# Patient Record
Sex: Female | Born: 1950 | Marital: Married | Smoking: Never smoker
Health system: Southern US, Community
[De-identification: ages and names within clinical notes are randomized; demographics above are authoritative.]

## PROBLEM LIST (undated history)

## (undated) ENCOUNTER — Encounter

## (undated) ENCOUNTER — Ambulatory Visit

## (undated) ENCOUNTER — Encounter: Attending: Family | Primary: Family

## (undated) ENCOUNTER — Encounter
Attending: Student in an Organized Health Care Education/Training Program | Primary: Student in an Organized Health Care Education/Training Program

## (undated) ENCOUNTER — Other Ambulatory Visit

## (undated) ENCOUNTER — Ambulatory Visit
Payer: BLUE CROSS/BLUE SHIELD | Attending: Student in an Organized Health Care Education/Training Program | Primary: Student in an Organized Health Care Education/Training Program

## (undated) ENCOUNTER — Ambulatory Visit: Payer: BLUE CROSS/BLUE SHIELD | Attending: Family | Primary: Family

## (undated) ENCOUNTER — Telehealth

## (undated) ENCOUNTER — Ambulatory Visit: Payer: BLUE CROSS/BLUE SHIELD

## (undated) ENCOUNTER — Telehealth: Attending: Family | Primary: Family

## (undated) ENCOUNTER — Ambulatory Visit: Payer: BLUE CROSS/BLUE SHIELD | Attending: Mental Health | Primary: Mental Health

## (undated) ENCOUNTER — Encounter: Attending: Audiologist | Primary: Audiologist

## (undated) ENCOUNTER — Other Ambulatory Visit: Attending: Mental Health | Primary: Mental Health

## (undated) ENCOUNTER — Ambulatory Visit: Payer: Medicaid (Managed Care) | Attending: Family | Primary: Family

## (undated) ENCOUNTER — Ambulatory Visit: Payer: BLUE CROSS/BLUE SHIELD | Attending: Audiologist | Primary: Audiologist

## (undated) ENCOUNTER — Ambulatory Visit: Payer: BLUE CROSS/BLUE SHIELD | Attending: Clinical | Primary: Clinical

## (undated) ENCOUNTER — Ambulatory Visit: Payer: Medicaid (Managed Care)

## (undated) ENCOUNTER — Encounter: Attending: Mental Health | Primary: Mental Health

## (undated) DIAGNOSIS — I10 Essential (primary) hypertension: Secondary | ICD-10-CM

## (undated) DIAGNOSIS — E079 Disorder of thyroid, unspecified: Secondary | ICD-10-CM

## (undated) DIAGNOSIS — Z21 Asymptomatic human immunodeficiency virus [HIV] infection status: Secondary | ICD-10-CM

---

## 2011-08-02 ENCOUNTER — Ambulatory Visit: Payer: Self-pay | Admitting: Internal Medicine

## 2012-12-11 ENCOUNTER — Emergency Department: Payer: Self-pay | Admitting: Emergency Medicine

## 2012-12-11 ENCOUNTER — Ambulatory Visit: Payer: Self-pay | Admitting: Family Medicine

## 2012-12-11 LAB — COMPREHENSIVE METABOLIC PANEL
Albumin: 3.5 g/dL (ref 3.4–5.0)
Alkaline Phosphatase: 116 U/L (ref 50–136)
Co2: 25 mmol/L (ref 21–32)
EGFR (African American): >60
EGFR (Non-African Amer.): >60
Glucose: 103 mg/dL — ABNORMAL HIGH (ref 65–99)
Osmolality: 272 (ref 275–301)
Potassium: 4.1 mmol/L (ref 3.5–5.1)
SGPT (ALT): 23 U/L (ref 12–78)
Sodium: 136 mmol/L (ref 136–145)

## 2012-12-11 LAB — CBC WITH DIFFERENTIAL/PLATELET
Eosinophil #: 0.3 10*3/uL (ref 0.0–0.7)
Eosinophil %: 5.7 %
HCT: 35.1 % (ref 35.0–47.0)
Lymphocyte #: 2.7 10*3/uL (ref 1.0–3.6)
MCHC: 31.7 g/dL — ABNORMAL LOW (ref 32.0–36.0)
MCV: 80 fL (ref 80–100)
Neutrophil %: 36 %
RBC: 4.39 10*6/uL (ref 3.80–5.20)
RDW: 15.9 % — ABNORMAL HIGH (ref 11.5–14.5)
WBC: 5.3 10*3/uL (ref 3.6–11.0)

## 2013-01-12 ENCOUNTER — Ambulatory Visit: Payer: Self-pay | Admitting: Obstetrics and Gynecology

## 2013-01-12 DIAGNOSIS — Z01810 Encounter for preprocedural cardiovascular examination: Secondary | ICD-10-CM

## 2013-01-12 LAB — BASIC METABOLIC PANEL
Calcium, Total: 8.9 mg/dL (ref 8.5–10.1)
Chloride: 105 mmol/L (ref 98–107)
EGFR (Non-African Amer.): >60
Glucose: 118 mg/dL — ABNORMAL HIGH (ref 65–99)
Potassium: 4.1 mmol/L (ref 3.5–5.1)
Sodium: 135 mmol/L — ABNORMAL LOW (ref 136–145)

## 2013-01-12 LAB — CBC
HGB: 10.7 g/dL — ABNORMAL LOW (ref 12.0–16.0)
MCH: 24.6 pg — ABNORMAL LOW (ref 26.0–34.0)
MCHC: 31.3 g/dL — ABNORMAL LOW (ref 32.0–36.0)
MCV: 79 fL — ABNORMAL LOW (ref 80–100)
RBC: 4.36 10*6/uL (ref 3.80–5.20)
RDW: 16.4 % — ABNORMAL HIGH (ref 11.5–14.5)
WBC: 5.8 10*3/uL (ref 3.6–11.0)

## 2013-01-25 ENCOUNTER — Ambulatory Visit: Payer: Self-pay | Admitting: Obstetrics and Gynecology

## 2013-01-26 LAB — PATHOLOGY REPORT

## 2014-04-15 ENCOUNTER — Ambulatory Visit: Payer: Self-pay | Admitting: Physician Assistant

## 2014-05-14 NOTE — Op Note (Signed)
PATIENT NAME:  Brenda Shah, Meelah A MR#:  [id number] DATE OF BIRTH:  05-Feb-1950  DATE OF PROCEDURE:  01/25/2013  PREOPERATIVE DIAGNOSES: 1.  Menorrhagia.  2.  Endometrial polyps.  3.  Postmenopausal bleeding.   POSTOPERATIVE DIAGNOSES: 1.  Menorrhagia.  2.  Endometrial polyps.  3.  Postmenopausal bleeding.  4.  Submucosal fibroids.   SURGEON: Herold HarmsMartin A Shavelle Runkel, M.D.   FIRST ASSISTANT: None.   ANESTHESIA: General endotracheal.   INDICATIONS: The patient is a 64 year old African American female who presents for surgical evaluation of menorrhagia and postmenopausal bleeding. Preoperative endometrial biopsy revealed endometrial polyp fragments. Ultrasound confirmed a heterogenous endometrial stripe suspicious for polyps.   FINDINGS AT SURGERY: Included several submucosal fibroids within the uterine cavity.  Each upon extraction were noted to be approximately 1.5 cm in diameter. Several other polyps were also seen.   DESCRIPTION OF PROCEDURE: The patient was brought to the Operating Room where she was placed in the supine position. General endotracheal anesthesia was induced without difficulty. She was placed in the dorsal lithotomy position using the candy-cane stirrups. A Betadine perineal, intravaginal prep and drape was performed in standard fashion. Red Robinson catheter was used to drain 50 mL of urine from the bladder. A weighted speculum was placed into the vagina, and a single-tooth tenaculum was placed on the anterior lip of the cervix. The uterus was sounded to 8 cm. The Hanks dilators were used up to a #20 French-caliber to dilate the endocervical canal. The [facility] hysteroscope using lactated Ringer's fluid as irrigant was utilized to assess the endometrial cavity. The above-noted findings were photo documented. The curettage was then performed with both smooth and serrated curettes. Stone polyp forceps and ring forceps were used to perform the myomectomies. Upon completion of the  surgical procedure, repeat hysteroscopy was performed with verification that the large lesions were removed. No other significant residual fibroids were noted to be left behind. The procedure was then terminated with all instrumentation being removed from the vagina. Using patient was then awakened, extubated and taken to the recovery room in satisfactory condition.   ESTIMATED BLOOD LOSS: 25 mL.   All instruments, needle and sponge counts were verified as correct.   COMPLICATIONS: None.   ____________________________ Prentice DockerMartin A. Elasha Tess, MD mad:cs D: 01/25/2013 14:11:00 ET T: 01/25/2013 18:49:07 ET JOB#: [id number]  cc: Daphine DeutscherMartin A. Kell Ferris, MD, <Dictator> [facility] Prentice DockerMARTIN A Yusuf Yu MD ELECTRONICALLY SIGNED 02/03/2013 13:05

## 2015-11-03 IMAGING — US US PELV - US TRANSVAGINAL
1 series · 13 of 25 positions shown · non-contrast
Comparison: None

CLINICAL DATA: Postmenopausal bleeding.

EXAM:
TRANSABDOMINAL AND TRANSVAGINAL ULTRASOUND OF PELVIS
TECHNIQUE: Both transabdominal and transvaginal ultrasound examinations of the
pelvis were performed. Transabdominal technique was performed for
global imaging of the pelvis including uterus, ovaries, adnexal
regions, and pelvic cul-de-sac. It was necessary to proceed with
endovaginal exam following the transabdominal exam to visualize the
uterus, endometrium, ovaries and adnexa .

[Series 1: us pelv - us transvaginal · 0.24mm/px · 13 of 51 slices shown]
[im 1/51]
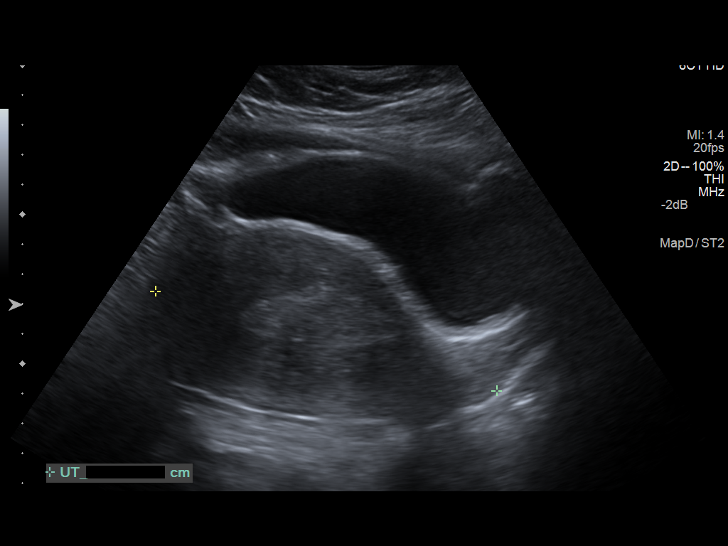
[im 5/51]
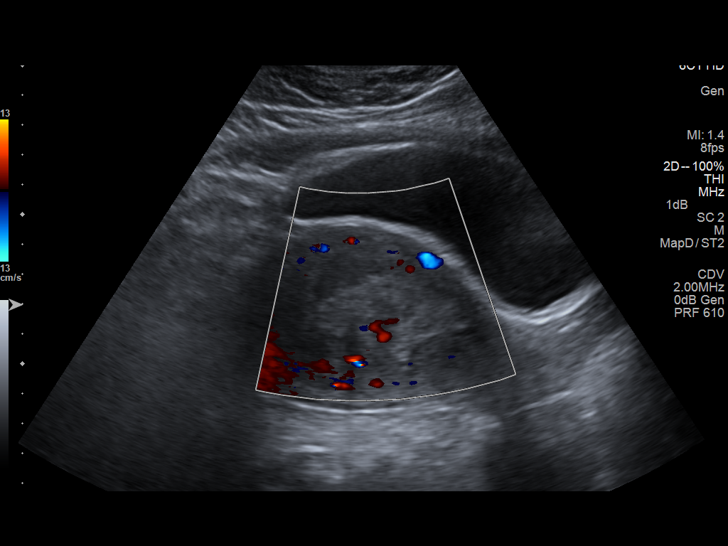
[im 9/51]
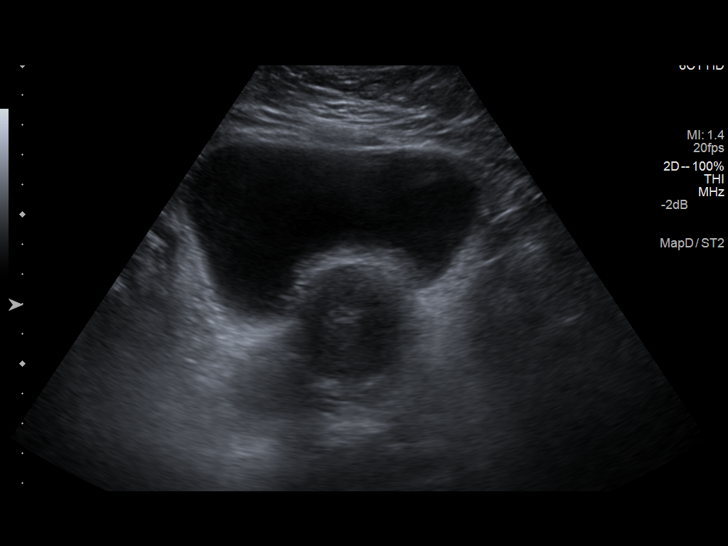
[im 13/51]
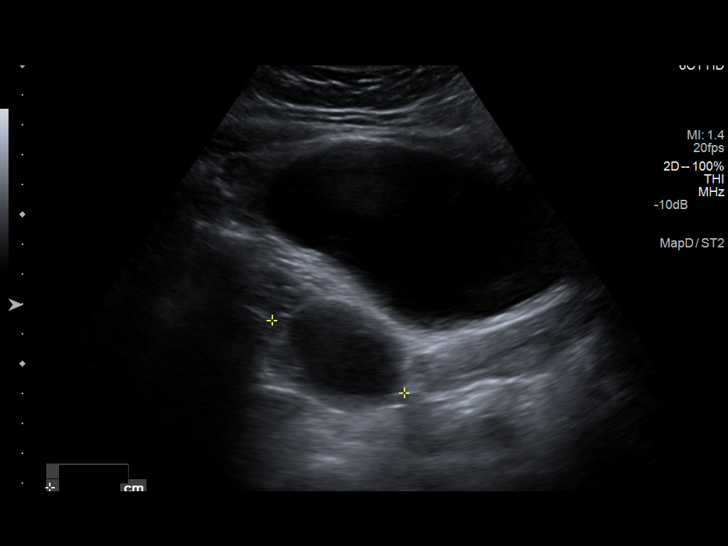
[im 17/51]
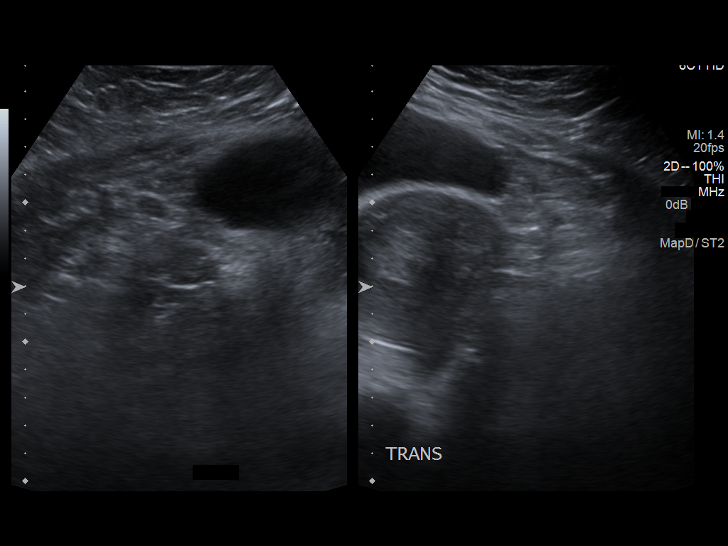
[im 21/51]
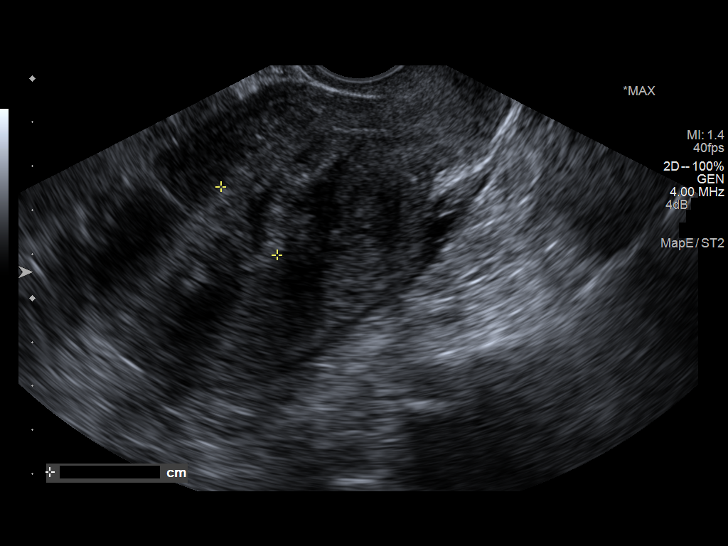
[im 26/51]
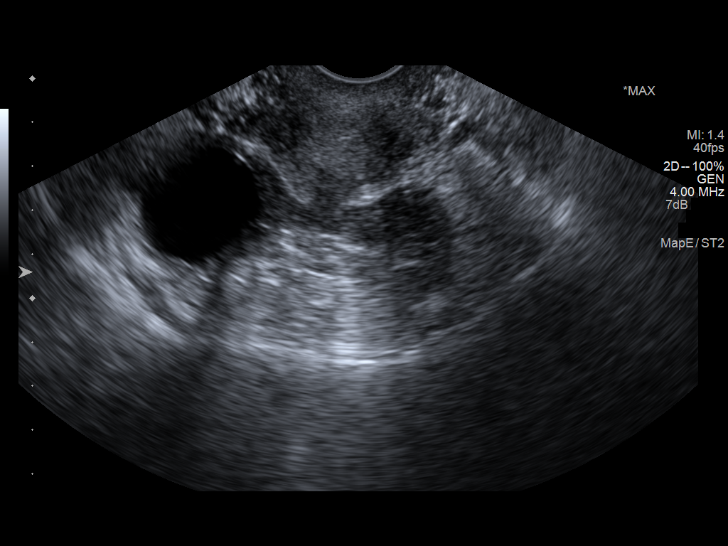
[im 30/51]
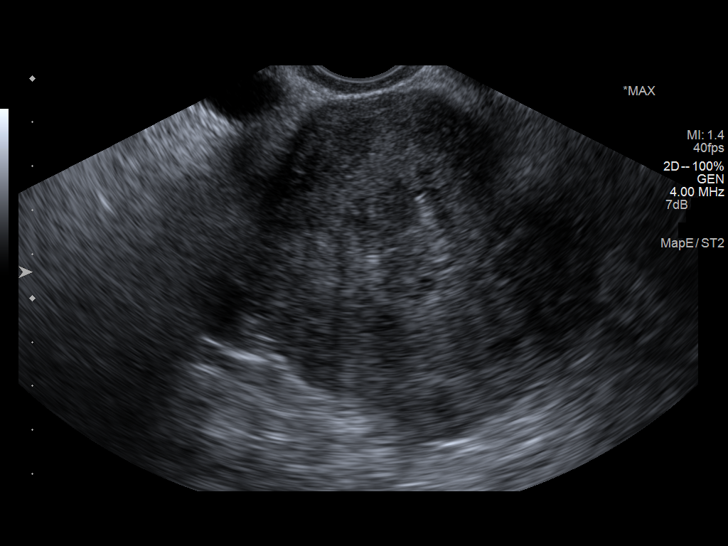
[im 34/51]
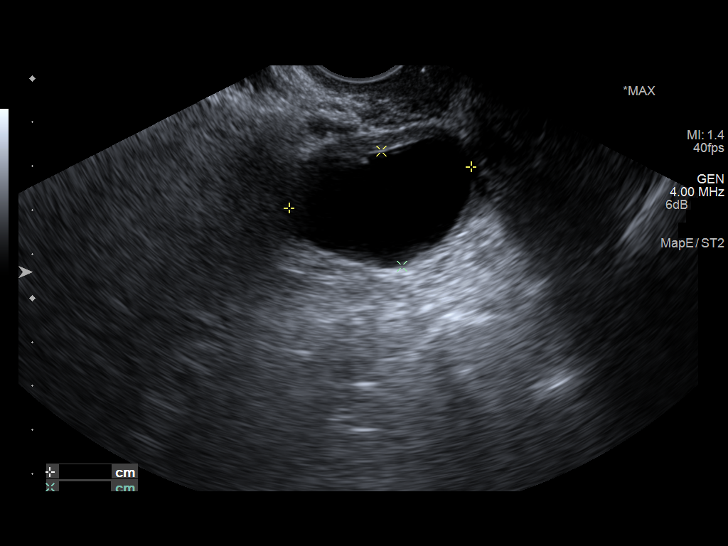
[im 38/51]
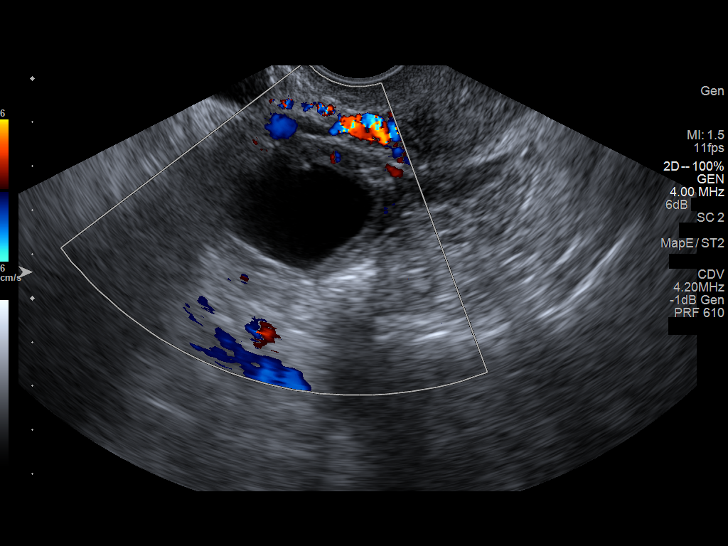
[im 42/51]
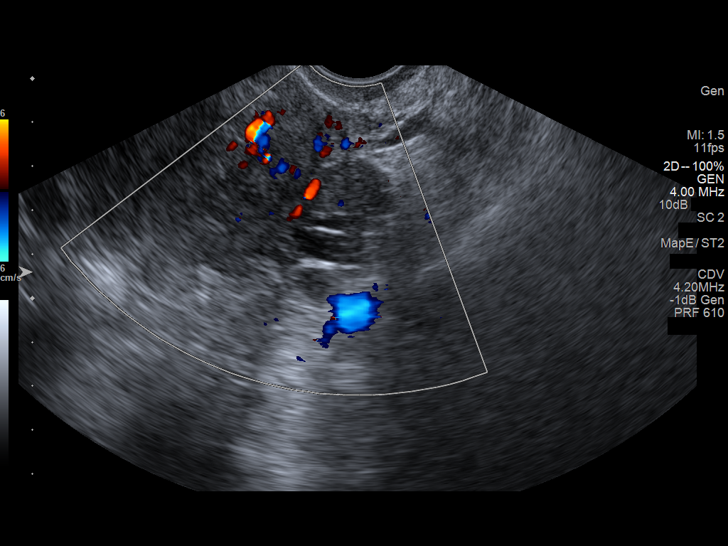
[im 46/51]
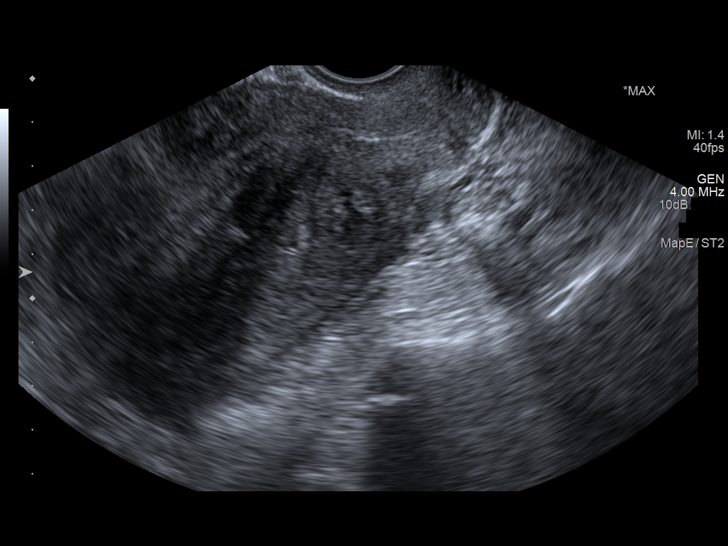
[im 51/51]
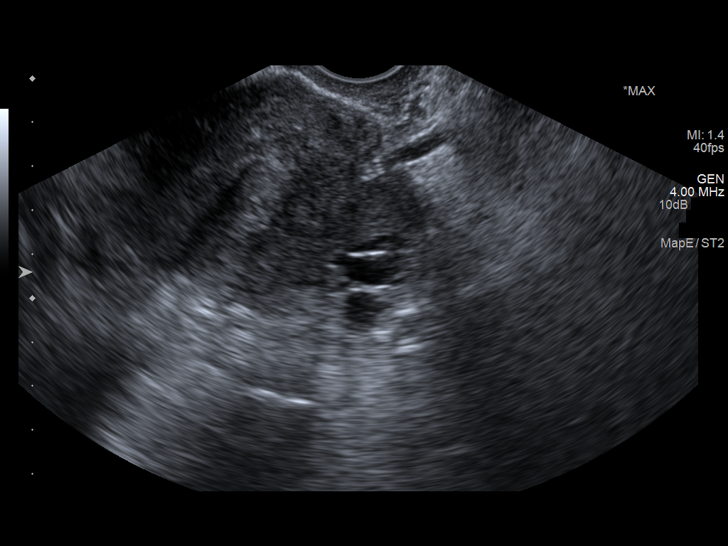

[13 of 25 positions shown; findings below may reference images not displayed]

FINDINGS: Uterus

Measurements: 11.9 x 5.8 x 7.4 cm. No focal abnormality. No fibroids
or other mass visualized.

Endometrium

Thickness: 23 mm in thickness, heterogeneous..

Right ovary

Measurements: 4.7 x 3.2 x 4.4 cm. 4.2 cm simple appearing cyst.

Left ovary

Measurements: 2.8 x 2.2 x 2.6 cm. Normal appearance/no adnexal mass.

Other findings

No free fluid.
IMPRESSION: Endometrium thickened at 23 mm and heterogeneous. Endometrial
thickness is considered abnormal for a post-menopausal female.
Endometrial sampling should be considered to exclude carcinoma.

4.2 cm simple appearing right ovarian cyst This is almost certainly
benign, but follow up ultrasound is recommended in 1 year according
to the Society of Radiologists in 8ltrasoundSBFB Consensus
Conference Statement (Bettini Tinoco et al. Management of Asymptomatic
Ovarian and Other Adnexal Cysts Imaged at US: Society of
Radiologists in Ultrasound Consensus Conference Statement 4454.
Radiology [DATE]): [id number].). .

## 2020-04-14 ENCOUNTER — Ambulatory Visit: Admit: 2020-04-14 | Discharge: 2020-04-15 | Payer: BLUE CROSS/BLUE SHIELD

## 2020-04-14 DIAGNOSIS — Z9189 Other specified personal risk factors, not elsewhere classified: Principal | ICD-10-CM

## 2020-04-14 DIAGNOSIS — H9190 Unspecified hearing loss, unspecified ear: Principal | ICD-10-CM

## 2020-04-14 DIAGNOSIS — Z0000 Encounter for general adult medical examination without abnormal findings: Principal | ICD-10-CM

## 2020-04-14 DIAGNOSIS — D259 Leiomyoma of uterus, unspecified: Principal | ICD-10-CM

## 2020-04-14 DIAGNOSIS — Z1159 Encounter for screening for other viral diseases: Principal | ICD-10-CM

## 2020-04-14 DIAGNOSIS — L309 Dermatitis, unspecified: Principal | ICD-10-CM

## 2020-04-14 DIAGNOSIS — Z1239 Encounter for other screening for malignant neoplasm of breast: Principal | ICD-10-CM

## 2020-04-14 DIAGNOSIS — Z0100 Encounter for examination of eyes and vision without abnormal findings: Principal | ICD-10-CM

## 2020-04-14 DIAGNOSIS — F1010 Alcohol abuse, uncomplicated: Principal | ICD-10-CM

## 2020-04-14 MED ORDER — HYDROXYZINE HCL 25 MG TABLET
ORAL_TABLET | Freq: Three times a day (TID) | ORAL | 0 refills | 30 days | Status: CP | PRN
Start: 2020-04-14 — End: ?

## 2020-04-14 MED ORDER — TRIAMCINOLONE ACETONIDE 0.1 % TOPICAL CREAM
Freq: Two times a day (BID) | TOPICAL | 1 refills | 0 days | Status: CP
Start: 2020-04-14 — End: ?

## 2020-04-17 DIAGNOSIS — R7989 Other specified abnormal findings of blood chemistry: Principal | ICD-10-CM

## 2020-06-27 DIAGNOSIS — Z1239 Encounter for other screening for malignant neoplasm of breast: Principal | ICD-10-CM

## 2020-06-28 ENCOUNTER — Encounter: Admit: 2020-06-28 | Discharge: 2020-06-29 | Payer: BLUE CROSS/BLUE SHIELD

## 2020-06-29 ENCOUNTER — Encounter
Admit: 2020-06-29 | Discharge: 2020-06-29 | Payer: BLUE CROSS/BLUE SHIELD | Attending: Student in an Organized Health Care Education/Training Program | Primary: Student in an Organized Health Care Education/Training Program

## 2020-06-29 ENCOUNTER — Encounter: Admit: 2020-06-29 | Discharge: 2020-06-29 | Payer: BLUE CROSS/BLUE SHIELD

## 2020-06-29 DIAGNOSIS — H25813 Combined forms of age-related cataract, bilateral: Principal | ICD-10-CM

## 2020-06-29 DIAGNOSIS — M3500 Sicca syndrome, unspecified: Principal | ICD-10-CM

## 2020-06-29 DIAGNOSIS — H5203 Hypermetropia, bilateral: Principal | ICD-10-CM

## 2020-06-29 DIAGNOSIS — Z0100 Encounter for examination of eyes and vision without abnormal findings: Principal | ICD-10-CM

## 2020-06-29 DIAGNOSIS — H04123 Dry eye syndrome of bilateral lacrimal glands: Principal | ICD-10-CM

## 2020-06-29 DIAGNOSIS — H3581 Retinal edema: Principal | ICD-10-CM

## 2020-07-04 DIAGNOSIS — M810 Age-related osteoporosis without current pathological fracture: Principal | ICD-10-CM

## 2020-07-04 MED ORDER — ALENDRONATE 70 MG TABLET
ORAL_TABLET | ORAL | 11 refills | 28 days | Status: CP
Start: 2020-07-04 — End: 2021-07-04

## 2020-07-06 ENCOUNTER — Ambulatory Visit: Admit: 2020-07-06 | Payer: BLUE CROSS/BLUE SHIELD | Attending: Audiologist | Primary: Audiologist

## 2020-07-10 ENCOUNTER — Ambulatory Visit
Admit: 2020-07-10 | Discharge: 2020-07-11 | Payer: BLUE CROSS/BLUE SHIELD | Attending: Student in an Organized Health Care Education/Training Program | Primary: Student in an Organized Health Care Education/Training Program

## 2020-07-11 ENCOUNTER — Ambulatory Visit: Admit: 2020-07-11 | Discharge: 2020-07-12 | Payer: BLUE CROSS/BLUE SHIELD

## 2020-07-11 DIAGNOSIS — B20 Human immunodeficiency virus [HIV] disease: Principal | ICD-10-CM

## 2020-07-11 DIAGNOSIS — E785 Hyperlipidemia, unspecified: Principal | ICD-10-CM

## 2020-07-11 DIAGNOSIS — L89302 Pressure ulcer of unspecified buttock, stage 2: Principal | ICD-10-CM

## 2020-07-11 DIAGNOSIS — M810 Age-related osteoporosis without current pathological fracture: Principal | ICD-10-CM

## 2020-07-11 DIAGNOSIS — H3500 Unspecified background retinopathy: Principal | ICD-10-CM

## 2020-07-11 DIAGNOSIS — Z8669 Personal history of other diseases of the nervous system and sense organs: Principal | ICD-10-CM

## 2020-07-11 DIAGNOSIS — E039 Hypothyroidism, unspecified: Principal | ICD-10-CM

## 2020-07-11 DIAGNOSIS — D649 Anemia, unspecified: Principal | ICD-10-CM

## 2020-07-11 MED ORDER — MUPIROCIN 2 % TOPICAL OINTMENT
Freq: Three times a day (TID) | TOPICAL | 0 refills | 7.00000 days | Status: CP
Start: 2020-07-11 — End: 2020-07-18

## 2020-07-11 MED ORDER — LEVOTHYROXINE 50 MCG TABLET
ORAL_TABLET | Freq: Every day | ORAL | 1 refills | 90.00000 days | Status: CP
Start: 2020-07-11 — End: 2020-07-11

## 2020-07-11 MED ORDER — SULFAMETHOXAZOLE 400 MG-TRIMETHOPRIM 80 MG TABLET
ORAL_TABLET | Freq: Two times a day (BID) | ORAL | 0 refills | 10.00000 days | Status: CP
Start: 2020-07-11 — End: 2020-07-21

## 2020-07-17 MED ORDER — ALENDRONATE 70 MG TABLET
ORAL_TABLET | ORAL | 11 refills | 28 days | Status: CP
Start: 2020-07-17 — End: 2021-07-17

## 2020-07-20 ENCOUNTER — Ambulatory Visit: Admit: 2020-07-20 | Discharge: 2020-07-21 | Payer: BLUE CROSS/BLUE SHIELD | Attending: Clinical | Primary: Clinical

## 2020-07-20 ENCOUNTER — Ambulatory Visit: Admit: 2020-07-20 | Discharge: 2020-07-21 | Payer: BLUE CROSS/BLUE SHIELD | Attending: Family | Primary: Family

## 2020-07-20 DIAGNOSIS — B20 Human immunodeficiency virus [HIV] disease: Principal | ICD-10-CM

## 2020-07-20 DIAGNOSIS — R238 Other skin changes: Principal | ICD-10-CM

## 2020-07-20 MED ORDER — VALACYCLOVIR 1 GRAM TABLET
ORAL_TABLET | Freq: Two times a day (BID) | ORAL | 0 refills | 10 days | Status: CP
Start: 2020-07-20 — End: 2020-07-30
  Filled 2020-07-20: qty 20, 10d supply, fill #0

## 2020-07-20 MED ORDER — BICTEGRAVIR 50 MG-EMTRICITABINE 200 MG-TENOFOVIR ALAFENAM 25 MG TABLET
ORAL_TABLET | Freq: Every day | ORAL | 0 refills | 30.00000 days | Status: CP
Start: 2020-07-20 — End: ?

## 2020-07-20 MED ORDER — CLINDAMYCIN HCL 300 MG CAPSULE
ORAL_CAPSULE | Freq: Four times a day (QID) | ORAL | 0 refills | 5.00000 days | Status: CP
Start: 2020-07-20 — End: 2020-07-25
  Filled 2020-07-20: qty 20, 5d supply, fill #0

## 2020-07-21 MED ORDER — SULFAMETHOXAZOLE 400 MG-TRIMETHOPRIM 80 MG TABLET
ORAL_TABLET | Freq: Every day | ORAL | 5 refills | 30 days | Status: CP
Start: 2020-07-21 — End: ?
  Filled 2020-07-26: qty 30, 30d supply, fill #0

## 2020-07-21 MED ORDER — BICTEGRAVIR 50 MG-EMTRICITABINE 200 MG-TENOFOVIR ALAFENAM 25 MG TABLET
ORAL_TABLET | Freq: Every day | ORAL | 5 refills | 30.00000 days | Status: CP
Start: 2020-07-21 — End: ?
  Filled 2020-07-20 – 2020-08-14 (×2): qty 30, 30d supply, fill #0

## 2020-07-27 DIAGNOSIS — B009 Herpesviral infection, unspecified: Principal | ICD-10-CM

## 2020-07-27 DIAGNOSIS — B20 Human immunodeficiency virus [HIV] disease: Principal | ICD-10-CM

## 2020-07-27 MED ORDER — VALACYCLOVIR 500 MG TABLET
ORAL_TABLET | Freq: Two times a day (BID) | ORAL | 5 refills | 30.00000 days | Status: CP
Start: 2020-07-27 — End: 2020-07-27
  Filled 2020-07-28: qty 60, 30d supply, fill #0

## 2020-08-14 NOTE — Unmapped (Signed)
[facility] Specialty Medication Onboarding    Specialty Medication: BIKTARVY 50-200-25 mg tablet (bictegrav-emtricit-tenofov ala)  Prior Authorization: Not Required   Financial Assistance: No - copay  <$25  Final Copay/Day Supply: $4 / 30    Insurance Restrictions: None     Notes to Pharmacist: n/a    The triage team has completed the benefits investigation and has determined that the patient is able to fill this medication at [facility]. Please contact the patient to complete the onboarding or follow up with the prescribing physician as needed.

## 2020-08-14 NOTE — Unmapped (Signed)
-----   Message from Amie Portland, FNP sent at 06/28/2020  4:50 PM EDT -----  Regarding: RE: Need new orders placed  Thank you for notifying me   I was not aware of any lump that she was concerned about  I have reached out to the pt to schedule an appt for evaluation   Ali Lowe FNP-C  ----- Message -----  From: Barnett Hatter, RT  Sent: 06/28/2020   2:21 PM EDT  To: Henrine Screws, Amie Portland, FNP  Subject: Need new orders placed                           Also sent on secure chat:    Patient [facility] for a screening mammogram and states that she has a lump in the Right Medial Breast.  Patient needs to be Rescheduled and a New order placed for Bilat diagnostic mammogram with tomo and Right Breast Ultrasound Limited.  If you will please place the orders, we can call the pt and then schedule appt.  Thanks!!    [employer] RTRM

## 2020-08-14 NOTE — Unmapped (Signed)
[facility]   Patient Onboarding/Medication Counseling    Ms.Breau is a 70 y.o. female with HIV who I am counseling today on continuation of therapy.  I am speaking to the patient's family member, Saralyn Pilar (daughter).    Was a Nurse, learning disability used for this call? No    Verified patient's date of birth / HIPAA.    Specialty medication(s) to be sent: Infectious Disease: Biktarvy      Non-specialty medications/supplies to be sent: smz-tmp 400-80mg  and valacyclovir 500mg       Medications not needed at this time: n/a         Biktarvy (bictegravir, emtricitabine, and tenofovir alafenamide)    Medication & Administration     Dosage: Take 1 tablet by mouth daily    Administration: Take without regard to food    Adherence/Missed dose instructions: take missed dose as soon as you remember. If it is close to the time of your next dose, skip the dose and resume with your next scheduled dose.    Goals of Therapy     To suppress viral replication and keep patient's HIV undetectable by lab tests    Side Effects & Monitoring Parameters     Common Side Effects:  ??? Diarrhea  ??? Upset stomach  ??? Headache  ??? Changes in Weight  ??? Changes in mood    The following side effects should be reported to the provider:     ?? If patient experiences: signs of an allergic reaction (rash; hives; itching; red, swollen, blistered, or peeling skin with or without fever; wheezing; tightness in the chest or throat; trouble breathing, swallowing, or talking; unusual hoarseness; or swelling of the mouth, face, lips, tongue, or throat)  ?? signs of kidney problems (unable to pass urine, change in how much urine is passed, blood in the urine, or a big weight gain)  ?? signs of liver problems (dark urine, feeling tired, not hungry, upset stomach or stomach pain, light-colored stools, throwing up, or yellow skin or eyes)  ?? signs of lactic acidosis (fast breathing, fast heartbeat, a heartbeat that does not feel normal, very bad upset stomach or throwing up, feeling very sleepy, shortness of breath, feeling very tired or weak, very bad dizziness, feeling cold, or muscle pain or cramps)  Weight gain: some patients have reported weight gain after starting this medication. The amount of weight can vary.    Monitoring Parameters:  ?? CD4  Count  ?? HIV RNA plasma levels,  ?? Liver function  ?? Total bilirubin  ?? serum creatinine  ?? urine glucose  ?? urine protein (prior to or when initiating therapy and as clinically indicated during therapy);       Drug/Food Interactions     ??? Medication list reviewed in Epic. The patient was instructed to inform the care team before taking any new medications or supplements. No drug interactions identified.   ??? Calcium Salts: May decrease the serum concentration of Biktarvy. If taken with food, Biktarvy can be administered with calcium salts.   ??? Iron Preparations: May decrease the serum concentration of Biktarvy. If taken with food, Biktarvy can be administered with Ferrous sulfate. If taken on an empty stomach, Biktarvy must be taken 2 hours before ferrous sulfate. Avoid other iron salts.    Contraindications, Warnings, & Precautions     ??? Black Box Warning: Severe acute exacertbations of HBV have been reported in patients coinfected with HIV-1 and HBV fllowing discontinuation of therapy  ??? Coadministration  with dofetilide, rifampin is contraindicated  ??? Immune reconstitution syndrome: Patients may develop immune reconstitution syndrome, resulting in the occurrence of an inflammatory response to an indolent or residual opportunistic infection or activation of autoimmune disorders (eg, Graves disease, polymyositis, Guillain-Barr?? syndrome, autoimmune hepatitis)   ??? Lactic acidosis/hepatomegaly  ??? Renal toxicity: patients with preexisting renal impairment and those taking nephrotoxic agents (including NSAIDs) are at increased risk.     Storage, Handling Precautions, & Disposal     ??? Store in the original container at room temperature.   ??? Keep lid tightly closed.   ??? Store in a dry place. Do not store in a bathroom.   ??? Keep all drugs in a safe place. Keep all drugs out of the reach of children and pets.   ??? Throw away unused or expired drugs. Do not flush down a toilet or pour down a drain unless you are told to do so. Check with your pharmacist if you have questions about the best way to throw out drugs. There may be drug take-back programs in your area.        Current Medications (including OTC/herbals), Comorbidities and Allergies     Current Outpatient Medications   Medication Sig Dispense Refill   ??? alendronate (FOSAMAX) 70 MG tablet Take 1 tablet (70 mg total) by mouth every seven (7) days. 4 tablet 11   ??? bictegrav-emtricit-tenofov ala (BIKTARVY) 50-200-25 mg tablet Take 1 tablet by mouth in the morning. 30 tablet 5   ??? hydrOXYzine (ATARAX) 25 MG tablet Take 1 tablet (25 mg total) by mouth Three (3) times a day as needed. 90 tablet 0   ??? levothyroxine (SYNTHROID) 50 MCG tablet Take 1 tablet (50 mcg total) by mouth in the morning. 90 tablet 1   ??? multivitamin (TAB-A-VITE/THERAGRAN) per tablet Take 1 tablet by mouth daily.     ??? sulfamethoxazole-trimethoprim (BACTRIM) 400-80 mg per tablet Take 1 tablet (80 mg of trimethoprim total) by mouth in the morning. 30 tablet 5   ??? triamcinolone (KENALOG) 0.1 % cream Apply topically Two (2) times a day. (Patient not taking: Reported on 08/14/2020) 80 g 1   ??? valACYclovir (VALTREX) 500 MG tablet Take 1 tablet (500 mg total) by mouth Two (2) times a day. Start taking this medicine after you finish the first prescription for Valtrex (valacyclovir) that I gave you. 60 tablet 5     No current facility-administered medications for this visit.       Allergies   Allergen Reactions   ??? Tetanus-Diphtheria Toxoids-Td Rash       Patient Active Problem List   Diagnosis   ??? Dermatitis   ??? Alcohol abuse   ??? Uterine leiomyoma   ??? Hearing loss   ??? HIV infection (CMS-HCC)   ??? History of Bell's palsy ??? Hyperlipidemia   ??? Anemia   ??? Hypothyroidism   ??? Pressure injury of buttock, stage 2 (CMS-HCC)   ??? Retinopathy   ??? Osteoporosis       Reviewed and up to date in Epic.    Appropriateness of Therapy     Acute infections noted within Epic:  No active infections  Patient reported infection: None    Is medication and dose appropriate based on diagnosis and infection status? Yes    Prescription has been clinically reviewed: Yes      Baseline Quality of Life Assessment      How many days over the past month did your HIV  keep you from your  normal activities? For example, brushing your teeth or getting up in the morning. 0    Financial Information     Medication Assistance provided: None Required    Anticipated copay of $4.00 reviewed with patient. Verified delivery address.    Delivery Information     Scheduled delivery date: 08/15/20 for Biktarvy and 08/22/20 for valacyclovir and smz-tmp    Expected start date: continuation of current therapy    Medication will be delivered via Next Day Courier for Biktarvy and UPS for valacyclovir and smz-tmp to the prescription address in Epic WAM.  This shipment will not require a signature.      Explained the services we provide at [facility] and that each month we would call to set up refills.  Stressed importance of returning phone calls so that we could ensure they receive their medications in time each month.  Informed patient that we should be setting up refills 7-10 days prior to when they will run out of medication.  A pharmacist will reach out to perform a clinical assessment periodically.  Informed patient that a welcome packet, containing information about our pharmacy and other support services, a Notice of Privacy Practices, and a drug information handout will be sent.      The patient or caregiver noted above participated in the development of this care plan and knows that they can request review of or adjustments to the care plan at any time. Patient or caregiver verbalized understanding of the above information as well as how to contact the pharmacy at [phone] option 4 with any questions/concerns.  The pharmacy is open Monday through Friday 8:30am-4:30pm.  A pharmacist is available 24/7 via pager to answer any clinical questions they may have.    Patient Specific Needs     - Does the patient have any physical, cognitive, or cultural barriers? No    - Does the patient have adequate living arrangements? (i.e. the ability to store and take their medication appropriately) Yes    - Did you identify any home environmental safety or security hazards? No    - Patient prefers to have medications discussed with  Family Member     - Is the patient or caregiver able to read and understand education materials at a high school level or above? Yes    - [name] primary language is  Tshiluba     - Is the patient high risk? No    - Does the patient require physician intervention or other additional services (i.e. dietary/nutrition, smoking cessation, social work)? No      Roderic Palau  [facility] Specialty Pharmacist

## 2020-08-21 MED FILL — VALACYCLOVIR 500 MG TABLET: ORAL | 30 days supply | Qty: 60 | Fill #1

## 2020-08-21 MED FILL — SULFAMETHOXAZOLE 400 MG-TRIMETHOPRIM 80 MG TABLET: ORAL | 30 days supply | Qty: 30 | Fill #1

## 2020-09-06 ENCOUNTER — Ambulatory Visit: Admit: 2020-09-06 | Discharge: 2020-09-07 | Payer: BLUE CROSS/BLUE SHIELD

## 2020-09-06 DIAGNOSIS — B20 Human immunodeficiency virus [HIV] disease: Principal | ICD-10-CM

## 2020-09-06 DIAGNOSIS — M810 Age-related osteoporosis without current pathological fracture: Principal | ICD-10-CM

## 2020-09-06 DIAGNOSIS — E039 Hypothyroidism, unspecified: Principal | ICD-10-CM

## 2020-09-06 DIAGNOSIS — E785 Hyperlipidemia, unspecified: Principal | ICD-10-CM

## 2020-09-06 DIAGNOSIS — F1010 Alcohol abuse, uncomplicated: Principal | ICD-10-CM

## 2020-09-06 MED ORDER — LEVOTHYROXINE 50 MCG TABLET
ORAL_TABLET | Freq: Every day | ORAL | 1 refills | 90 days | Status: CP
Start: 2020-09-06 — End: ?
  Filled 2020-10-11: qty 90, 90d supply, fill #0

## 2020-09-06 MED ORDER — ALENDRONATE 70 MG TABLET
ORAL_TABLET | ORAL | 11 refills | 28.00000 days | Status: CP
Start: 2020-09-06 — End: 2021-09-06
  Filled 2020-10-11: qty 4, 28d supply, fill #0

## 2020-09-06 NOTE — Unmapped (Signed)
[facility] Clinical Assessment & Refill Coordination Note    Joan Burns, DOB: 1950-09-03  Phone: [phone] (home)     All above HIPAA information was verified with patient's family member, Saralyn Pilar (daughter).     Was a Nurse, learning disability used for this call? No    Specialty Medication(s):   Infectious Disease: Biktarvy     Current Outpatient Medications   Medication Sig Dispense Refill   ??? alendronate (FOSAMAX) 70 MG tablet Take 1 tablet (70 mg total) by mouth every seven (7) days. 4 tablet 11   ??? bictegrav-emtricit-tenofov ala (BIKTARVY) 50-200-25 mg tablet Take 1 tablet by mouth in the morning. 30 tablet 5   ??? hydrOXYzine (ATARAX) 25 MG tablet Take 1 tablet (25 mg total) by mouth Three (3) times a day as needed. 90 tablet 0   ??? levothyroxine (SYNTHROID) 50 MCG tablet Take 1 tablet (50 mcg total) by mouth in the morning. 90 tablet 1   ??? multivitamin (TAB-A-VITE/THERAGRAN) per tablet Take 1 tablet by mouth daily.     ??? sulfamethoxazole-trimethoprim (BACTRIM) 400-80 mg per tablet Take 1 tablet (80 mg of trimethoprim total) by mouth in the morning. 30 tablet 5   ??? triamcinolone (KENALOG) 0.1 % cream Apply topically Two (2) times a day. (Patient not taking: Reported on 08/14/2020) 80 g 1   ??? valACYclovir (VALTREX) 500 MG tablet Take 1 tablet (500 mg total) by mouth Two (2) times a day. Start taking this medicine after you finish the first prescription for Valtrex (valacyclovir) that I gave you. 60 tablet 5     No current facility-administered medications for this visit.        Changes to medications: no changes    Allergies   Allergen Reactions   ??? Tetanus-Diphtheria Toxoids-Td Rash       Changes to allergies: No    SPECIALTY MEDICATION ADHERENCE     Biktarvy 50-200-25 mg: unknown number of days of medicine on hand       Medication Adherence    Patient reported X missed doses in the last month: 0  Specialty Medication: Biktarvy 50-200-25mg   Patient is on additional specialty medications: No  Demonstrates understanding of importance of adherence: yes  Informant: child/children  Reliability of informant: reliable  Provider-estimated medication adherence level: good  Patient is at risk for Non-Adherence: No          Specialty medication(s) dose(s) confirmed: Regimen is correct and unchanged.     Are there any concerns with adherence? No    Adherence counseling provided? Not needed    CLINICAL MANAGEMENT AND INTERVENTION      Clinical Benefit Assessment:    Do you feel the medicine is effective or helping your condition? Yes    Clinical Benefit counseling provided? Not needed    Adverse Effects Assessment:    Are you experiencing any side effects? No    Are you experiencing difficulty administering your medicine? No    Quality of Life Assessment:    How many days over the past month did your HIV  keep you from your normal activities? For example, brushing your teeth or getting up in the morning. 0    Have you discussed this with your provider? Not needed    Acute Infection Status:    Acute infections noted within Epic:  No active infections  Patient reported infection: None    Therapy Appropriateness:    Is therapy appropriate? Yes, therapy is appropriate and should be continued  DISEASE/MEDICATION-SPECIFIC INFORMATION      N/A    PATIENT SPECIFIC NEEDS     - Does the patient have any physical, cognitive, or cultural barriers? No    - Is the patient high risk? No    - Does the patient require a Care Management Plan? No     - Does the patient require physician intervention or other additional services (i.e. nutrition, smoking cessation, social work)? No      SHIPPING     Specialty Medication(s) to be Shipped:   Infectious Disease: Biktarvy    Other medication(s) to be shipped: valacyclovir and smz-tmp 400-80mg      Changes to insurance: No    Delivery Scheduled: Yes, Expected medication delivery date: 09/14/20.     Medication will be delivered via Same Day Courier to the confirmed prescription address in [facility].    The patient will receive a drug information handout for each medication shipped and additional FDA Medication Guides as required.  Verified that patient has previously received a Conservation officer, historic buildings and a Surveyor, mining.    The patient or caregiver noted above participated in the development of this care plan and knows that they can request review of or adjustments to the care plan at any time.      All of the patient's questions and concerns have been addressed.    Roderic Palau   [facility] Specialty Pharmacist

## 2020-09-07 NOTE — Unmapped (Signed)
Assessment and Plan:     Joan Burns was seen today for follow-up.  Pt is accompanied by her dtr who she prefers to have interpret for her     Diagnoses and all orders for this visit:    D/T inability to collect labs in clinic today the pt was referred to [facility] for lab work - Dtr states that she will transport pt     Hypothyroidism, unspecified type    Pt reports that she has not been taking the levothyroxine as I finished it  Discussed in detail with dtr and pt that this medication is an on going medication and pt should not stop taking it   They verbalized understanding and new rx sent in     -     levothyroxine (SYNTHROID) 50 MCG tablet; Take 1 tablet (50 mcg total) by mouth daily.  -     TSH; Future    Hyperlipidemia, unspecified hyperlipidemia type  -     Lipid panel (non-fasting); Future    Osteoporosis, unspecified osteoporosis type, unspecified pathological fracture presence    Pt was given new fosamax prescription 07/17/2020  She took one daily x4 days (as she did previously) and then stopped taking   I instructed her again on proper dosing of once weekly and then have called in another prescription   Pt and dtr verbalized understanding and can hopefully follow through this time with POC     -     alendronate (FOSAMAX) 70 MG tablet; Take 1 tablet (70 mg total) by mouth every seven (7) days.    HIV infection, unspecified symptom status (CMS-HCC)    Pt is followed by Infectious disease clinic with last appt 07/20/2020  On 07/27/2020, she was informed that her Ca+ was low and to start a supp which she did not do  She was also informed of HSV2 infection and need to start Valtrex for the next 6 months which she also did not do   She is encouraged to start both of these medications today     Alcohol abuse    Pt dtr states that pt has been an alcoholic her entire life and that she does not see this changing  However, the pt has decreased consumption to 3 cans of beer a day  Discussed cessation with pt Return in about 3 months (around 12/07/2020) for Recheck.    Subjective:     HPI: Joan Burns is a 70 y.o. female here for   Chief Complaint   Patient presents with   ??? Follow-up   :    Joan Burns was seen today for follow-up.  Pt is accompanied by her dtr who she prefers to have interpret for her     Diagnoses and all orders for this visit:  D/T inability to collect labs in clinic today the pt was referred to [facility] for lab work - Dtr states that she will transport pt     Hypothyroidism, unspecified type  Pt reports that she has not been taking the levothyroxine as I finished it  Discussed in detail with dtr and pt that this medication is an on going medication and pt should not stop taking it   They verbalized understanding and new rx sent in     Hyperlipidemia, unspecified hyperlipidemia type    Osteoporosis, unspecified osteoporosis type, unspecified pathological fracture presence  Pt was given new fosamax prescription 07/17/2020  She took one daily x4 days (as she did previously) and then stopped taking  I instructed her again on proper dosing of once weekly and then have called in another prescription   Pt and dtr verbalized understanding and can hopefully follow through this time with POC     HIV infection, unspecified symptom status (CMS-HCC)  Pt is followed by Infectious disease clinic with last appt 07/20/2020  On 07/27/2020, she was informed that her Ca+ was low and to start a supp which she did not do  She was also informed of HSV2 infection and need to start Valtrex for the next 6 months which she also did not do   She is encouraged to start both of these medications today     Alcohol abuse  Pt dtr states that pt has been an alcoholic her entire life and that she does not see this changing  However, the pt has decreased consumption to 3 cans of beer a day  Discussed cessation with pt         ROS:   Review of Systems   Constitutional: Negative for activity change, appetite change, fatigue and unexpected weight change.   Eyes: Negative for visual disturbance.   Respiratory: Negative for chest tightness and shortness of breath.    Cardiovascular: Negative for chest pain, palpitations and leg swelling.   Gastrointestinal: Negative for abdominal pain, blood in stool, constipation, diarrhea, nausea and vomiting.   Endocrine: Negative for cold intolerance and heat intolerance.   Musculoskeletal: Negative for gait problem.   Neurological: Negative for dizziness, light-headedness and headaches.   Hematological: Does not bruise/bleed easily.   Psychiatric/Behavioral: Negative for sleep disturbance. The patient is not nervous/anxious.         Review of systems negative unless otherwise noted as per HPI    Objective:     Visit Vitals  BP 114/66   Pulse 70   Temp 37.1 ??C (98.8 ??F) (Temporal)   Ht 156 cm (5' 1.4)   Wt 65.8 kg (145 lb)   SpO2 98%   BMI 27.04 kg/m??          09/06/20 1422   PainSc: 0-No pain        Physical Exam  Constitutional:       General: She is not in acute distress.     Appearance: Normal appearance. She is well-developed and well-groomed. She is not ill-appearing.   HENT:      Head: Normocephalic and atraumatic.      Mouth/Throat:      Lips: Pink.      Mouth: Mucous membranes are moist.   Eyes:      Conjunctiva/sclera: Conjunctivae normal.   Cardiovascular:      Rate and Rhythm: Normal rate and regular rhythm.      Heart sounds: Normal heart sounds, S1 normal and S2 normal.   Pulmonary:      Effort: Pulmonary effort is normal.      Breath sounds: Normal breath sounds and air entry.   Abdominal:      General: Bowel sounds are normal.      Tenderness: There is no abdominal tenderness.   Skin:     General: Skin is warm and dry.   Neurological:      Mental Status: She is alert and oriented to person, place, and time.   Psychiatric:         Attention and Perception: Attention and perception normal.         Mood and Affect: Mood and affect normal.         Speech:  Speech normal. Behavior: Behavior normal. Behavior is cooperative.         Thought Content: Thought content normal.          [facility]MH:     Medication adherence and barriers to the treatment plan have been addressed. Opportunities to optimize healthy behaviors have been discussed. Patient / caregiver voiced understanding.

## 2020-09-14 MED FILL — BIKTARVY 50 MG-200 MG-25 MG TABLET: ORAL | 30 days supply | Qty: 30 | Fill #1

## 2020-09-14 MED FILL — SULFAMETHOXAZOLE 400 MG-TRIMETHOPRIM 80 MG TABLET: ORAL | 30 days supply | Qty: 30 | Fill #2

## 2020-09-14 MED FILL — VALACYCLOVIR 500 MG TABLET: ORAL | 30 days supply | Qty: 60 | Fill #2

## 2020-09-14 NOTE — Unmapped (Signed)
Called [employer] to confirm there is a [place] language interpreter available for appointment tomorrow at 4pm (09/15/20). There is no appointment in system but I was able to set up a request. When asking for the language the scheduler said that Jannet Askew is the same as Tshiluba so we set up for that interpreter with Swahili interpreter as back up if first language not available. I have reached out to patient's daughter, [name], to confirm this is the case and the language is equivalent.

## 2020-09-15 ENCOUNTER — Ambulatory Visit: Admit: 2020-09-15 | Discharge: 2020-09-16 | Payer: BLUE CROSS/BLUE SHIELD | Attending: Family | Primary: Family

## 2020-09-15 DIAGNOSIS — B20 Human immunodeficiency virus [HIV] disease: Principal | ICD-10-CM

## 2020-09-15 DIAGNOSIS — B009 Herpesviral infection, unspecified: Principal | ICD-10-CM

## 2020-09-15 LAB — CBC W/ AUTO DIFF
BASOPHILS ABSOLUTE COUNT: 0 10*9/L (ref 0.0–0.1)
BASOPHILS RELATIVE PERCENT: 0.6 %
EOSINOPHILS ABSOLUTE COUNT: 0.4 10*9/L (ref 0.0–0.5)
EOSINOPHILS RELATIVE PERCENT: 9.1 %
HEMATOCRIT: 30.1 % — ABNORMAL LOW (ref 34.0–44.0)
HEMOGLOBIN: 9.8 g/dL — ABNORMAL LOW (ref 11.3–14.9)
LYMPHOCYTES ABSOLUTE COUNT: 1.7 10*9/L (ref 1.1–3.6)
LYMPHOCYTES RELATIVE PERCENT: 43.1 %
MEAN CORPUSCULAR HEMOGLOBIN CONC: 32.4 g/dL (ref 32.0–36.0)
MEAN CORPUSCULAR HEMOGLOBIN: 24.3 pg — ABNORMAL LOW (ref 25.9–32.4)
MEAN CORPUSCULAR VOLUME: 75.1 fL — ABNORMAL LOW (ref 77.6–95.7)
MEAN PLATELET VOLUME: 9.3 fL (ref 6.8–10.7)
MONOCYTES ABSOLUTE COUNT: 0.3 10*9/L (ref 0.3–0.8)
MONOCYTES RELATIVE PERCENT: 8.8 %
NEUTROPHILS ABSOLUTE COUNT: 1.5 10*9/L — ABNORMAL LOW (ref 1.8–7.8)
NEUTROPHILS RELATIVE PERCENT: 38.4 %
NUCLEATED RED BLOOD CELLS: 0 /100{WBCs} (ref <=4)
PLATELET COUNT: 184 10*9/L (ref 150–450)
RED BLOOD CELL COUNT: 4.01 10*12/L (ref 3.95–5.13)
RED CELL DISTRIBUTION WIDTH: 16.6 % — ABNORMAL HIGH (ref 12.2–15.2)
WBC ADJUSTED: 3.9 10*9/L (ref 3.6–11.2)

## 2020-09-15 LAB — LYMPH MARKER LIMITED,FLOW
ABSOLUTE CD3 CNT: 1479 {cells}/uL (ref 915–3400)
ABSOLUTE CD4 CNT: 102 {cells}/uL — ABNORMAL LOW (ref 510–2320)
ABSOLUTE CD8 CNT: 1343 {cells}/uL (ref 180–1520)
CD3% (T CELLS): 87 % — ABNORMAL HIGH (ref 61–86)
CD4% (T HELPER): 6 % — ABNORMAL LOW (ref 34–58)
CD4:CD8 RATIO: 0.1 — ABNORMAL LOW (ref 0.9–4.8)
CD8% T SUPPRESR: 79 % — ABNORMAL HIGH (ref 12–38)

## 2020-09-15 LAB — BASIC METABOLIC PANEL
ANION GAP: 4 mmol/L — ABNORMAL LOW (ref 5–14)
BLOOD UREA NITROGEN: 8 mg/dL — ABNORMAL LOW (ref 9–23)
BUN / CREAT RATIO: 13
CALCIUM: 8.5 mg/dL — ABNORMAL LOW (ref 8.7–10.4)
CHLORIDE: 106 mmol/L (ref 98–107)
CO2: 26.8 mmol/L (ref 20.0–31.0)
CREATININE: 0.63 mg/dL
EGFR CKD-EPI (2021) FEMALE: >90 mL/min/{1.73_m2} (ref >=60)
GLUCOSE RANDOM: 97 mg/dL (ref 70–179)
POTASSIUM: 3.8 mmol/L (ref 3.4–4.8)
SODIUM: 137 mmol/L (ref 135–145)

## 2020-09-15 LAB — ALT: ALT (SGPT): 8 U/L — ABNORMAL LOW (ref 10–49)

## 2020-09-15 LAB — BILIRUBIN, TOTAL: BILIRUBIN TOTAL: 0.3 mg/dL (ref 0.3–1.2)

## 2020-09-15 LAB — AST: AST (SGOT): 16 U/L (ref <=34)

## 2020-09-15 NOTE — Unmapped (Unsigned)
INFECTIOUS DISEASES CLINIC  [address]  [place], [place]  [address]  P [phone]  F [phone]     Primary care provider: Herschell Dimes, FNP    Assessment/Plan:      HIV (dx'd 2015, nadir CD4 76/4% in 07/20/20)  - chronic, severe  Patient diagnosed with HIV in 2015 as part of pre-op work up for myomectomy. Per report from daughter, patient had declined treatment and her daughters through the years had tried to get her into care but was unable to, both here in [place] and in [place]. They were unable to establish care for her in [place] because her Medicaid was in [place].    Patient speaks Tshiluba or Luba-Kasai, a [place] language, that can be accessed by [employer] (via telephone [phone]) if an appointment is made with them. Patient actually speaks a combination of Swahili and Tshiluba but prefers interpretation in [place]. Video interpretation does not include this language and patient does not understand [place] interpreter. [place] interpreter present for full discussion. Daughter not present.    Overall doing well. Current regimen: Biktarvy (BIC/FTC/TAF)  Misses doses of ARVs never    Med access via Medicaid  CD4 count 102/6% after starting Biktarvy, on Bactrim SS daily  Discussed ARV adherence and taking ARVs with food    Lab Results   Component Value Date    ACD4 102 (L) 09/15/2020    CD4 6 (L) 09/15/2020    HIVRS Detected (A) 07/20/2020    HIVCP 12,564 (H) 07/20/2020     ??? CD4, HIV RNA, and safety labs (full return panel)  ??? Continue current therapy On Biktarvy, tolerating well  ??? Discussed importance of ARV adherence  ??? Through interpreter, patient initially seemed to not be aware that she had HIV infection. She had questions about the infection, how she got infected, and whether or not she can continue to have sex. I explained HIV infection, importance of taking ART daily, importance of taking Bactrim, discussed CD4 and HIV viral load.       HIV retinopathy  - new  - not addressed at this visit  Diagnosed by Ophthalmology, Dr. Larry Sierras  Incidental finding of CWS when patient being evaluated for cataracts.  Daughter reports that patient has not complained of vision changes prior to this diagnosis.  ??? Continue close follow up with Opthalmology      HSV-2 infection/Perianal lesions  - acute, self-limited  Reports that lesion started by itching, which became a vesicle and then because of itching, the lesions opened. Watery, clear discharge has been draining since then, leasions seems to be getting bigger. Not experiencing pain, but is itchy.   07/11/20 Evaluated by PCP with surface swab revealing mixed gram positive organisms. Started on Bactrim SS BID.  Dr. Matt Holmes in to evaluate patient along with myself. Skin erosions are characteristic of HSV infection that may also be superinfected with bacteria.  ??? Patient appears to have stopped taking Valtrex 500mg  BID for suppression.   ??? She continues to have lesions to perianal area but much improved, almost all have healed over.  ??? Informed patient of HSV infection and importance of taking her medicine appropriately. She verbalized understanding and will continue to take her medicine.      Uterine leiomyoma - chronic  Had uterine surgery in 2014 per patient report  ?? Scheduled to see GYN 12/2020      Hypothyroidism - chronic, stable  Managed by PCP  ?? Treated with Synthroid  daily      Alcohol Abuse - chronic  Drinks 5 beers or wine a day  Since being on antibiotics, patient drinks 1-2 drinks daily.      Nicotine dependence  - never smoker      Reported history of Bell's Palsy and Shingles outbreak      Sexual health & secondary prevention  - abstinent  Not in relationship. Last sexual encounter >5 years ago  She is abstinent  She does not routinely discuss HIV status with partner(s).  Has children and post menopausal.    Lab Results   Component Value Date    RPR Nonreactive 07/20/2020    RPR Nonreactive 06/29/2020     ??? GC/CT NAATs - not being checked routinely for this patient  ??? RPR - repeat 12 months after prior      Health maintenance  - chronic, acutely exacerbated  PCP: Ali Lowe, FNP     Oral health  She unknown have a dentist. Last dental exam unknown.    Eye health  She does  use corrective lenses. Last eye exam followed by Opthalmology.    Metabolic conditions  Wt Readings from Last 5 Encounters:   09/15/20 67.1 kg (148 lb)   09/06/20 65.8 kg (145 lb)   07/20/20 65.3 kg (144 lb)   07/11/20 66.2 kg (146 lb)   04/14/20 68.5 kg (151 lb)     Lab Results   Component Value Date    CREATININE 0.63 09/15/2020    GLUCOSEU Negative 07/20/2020    GLU 97 09/15/2020    ALT 8 (L) 09/15/2020    ALT 12 07/20/2020    ALT 14 04/14/2020     # Kidney health - creatinine today  # Bone health - defer mgm't to PCP, on Fosamax  # Diabetes assessment - random glucose today  # NAFLD assessment - monitor over time    Communicable diseases  Lab Results   Component Value Date    QFTTBGOLD Negative 06/29/2020    HEPAIGG Reactive (A) 07/20/2020    HEPBSAB Reactive (A) 07/20/2020    HEPCAB Reactive (A) 04/14/2020    HCVRNA Not Detected 04/14/2020     # TB screening - no longer needed; negative IGRA, low risk  # Hepatitis screening - as noted: See above  # MMR screening - not assessed    Cancer screening  No results found for: PSASCRN, PSA, PAP, FINALDX  # Cervical - Defer management to GYN  # Breast - Defer management to GYN    # Anorectal - will disucss  # Colorectal - Defer to PCP  # Liver - no screening indicated  # Lung - screening not indicated    Cardiovascular disease  Lab Results   Component Value Date    CHOL 139 07/11/2020    HDL 33 (L) 07/11/2020    LDL 87 07/11/2020    NONHDL 106 07/11/2020    TRIG 93 07/11/2020     # The 10-year ASCVD risk score (Goff DC Jr., et al., 2013) is: 8.5%  - is not taking aspirin   - is not taking statin  - BP control good  - never smoker  # AAA screening - no indication for screening      There is no immunization history on file for this patient.    ??? Immunizations today - Discussed importance of immunizations with patient, she reports that now that she has been able to ask more questions about her HIV diagnosis she  would like to think more about immunizations. She is not ready to receive any at this time      Counseling services took more than 50% of today's visit.  I personally spent 40 minutes face-to-face and non-face-to-face in the care of this patient, which includes all pre, intra, and post visit time on the date of service.   Time for translation extended out our discussions.      Disposition  Next appointment: 8 weeks, Tshiluba/ Luba-Kasai interpreter requested for this visit      To do @ next RTC  ??? Immunizations    Varney Daily, FNP-BC  [facility] at [facility]  [address] [place], [place] [address]    Phone: [phone]   Fax: [phone]             Subjective      Chief Complaint   HIV follow up    HPI  In addition to details in A&P above:    ?? Patient lives with daughter, currently in [employer]nb and neither drive. Now uses Medicaid transportation.   ?? Patient has at least 3 daughters, 2 of whom tried to get her into HIV care but was unsuccessful. Patient reports today that she does not think her interpretation was complete and so she did not understand everything about her HIV diagnosis which is part of the reason she had not sought care. Patient greatly prefers Luba-Kasai interpretation but swahili can be used if not available for some reason.  ?? Denies any fever, chills, nausea, vomiting, rash, urinary complaints, diarrhea, constipation.  ?? Feeling good overall, denies any side effects from medications.    Past Medical History:   Diagnosis Date   ??? HIV disease (CMS-HCC)    ??? Other osteoporosis without current pathological fracture    ??? Other specified hypothyroidism          Social History  Background - From [place]. Works at [employer] doing [employer]. Lives with daughter currently in [place].    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 All others negative.      Medications and Allergies  She has a current medication list which includes the following prescription(s): alendronate, bictegrav-emtricit-tenofov ala, calcium carbonate-vitamin d3, polyvinyl alcohol/povidone/pf, sulfamethoxazole-trimethoprim, levothyroxine, and valacyclovir.    Allergies: Tetanus-diphtheria toxoids-td      Family History  Her family history is not on file.           Objective      BP 142/72 (BP Site: L Arm, BP Position: Sitting)  - Pulse 76  - Temp 36.9 ??C (98.4 ??F) (Oral)  - Ht 149.9 cm (4' 11)  - Wt 67.1 kg (148 lb)  - BMI 29.89 kg/m??      Const ??? WDWN, NAD, non-toxic appearance    Eyes ??? lids normal bilaterally, conjunctiva anicteric and noninjected OU   ??? PERRL    ENMT ??? normal appearance of external nose and ears, no nasal discharge   ??? OP clear    Neck ??? neck of normal appearance and trachea midline   ??? no thyromegaly, nodules, or tenderness    Lymph ??? no LAD in neck    CV ??? RRR, no m/r/g, S1/S2   ??? no  peripheral edema, WWP    Resp ??? normal WOB   ??? on RA, no breathlessness with speaking, no coughing, CTAB    GI ??? normal inspection, NTND, NABS   ??? no umbilical hernia on exam    GU ??? Perianal lesions, multiple, circular, confluent skin erosions but mostly healed over, 1-2 lesions are still open, but no erythema noted.    MSK ??? no clubbing or cyanosis of hands   ??? no focal tenderness or abnormalities of joints of RUE, LUE, RLE, or LLE    Skin ??? no rashes, lesions, or ulcers of visualized skin   ??? no nodules or areas of induration of palpated skin    Neuro ??? CNs II-XII grossly intact   ??? sensation to light touch grossly intact throughout    Psych ??? appropriate affect   ??? oriented to person, place, time ??? normal appearance of external nose and ears, no nasal discharge   ??? OP clear    Neck ??? neck of normal appearance and trachea midline   ??? no thyromegaly, nodules, or tenderness    Lymph ??? no LAD in neck    CV ??? RRR, no m/r/g, S1/S2   ??? no peripheral edema, WWP    Resp ??? normal WOB   ??? on RA, no breathlessness with speaking, no coughing, CTAB    GI ??? normal inspection, NTND, NABS   ??? no umbilical hernia on exam    GU ??? Perianal lesions, multiple, circular, confluent skin erosions with macerated border, some kissing lesions as well between buttocks. No erythema noted. No purulent drainage noted. Obtained swabs without incident.   MSK ??? no clubbing or cyanosis of hands   ??? no focal tenderness or abnormalities of joints of RUE, LUE, RLE, or LLE    Skin ??? no rashes, lesions, or ulcers of visualized skin   ??? no nodules or areas of induration of palpated skin    Neuro ??? CNs II-XII grossly intact   ??? sensation to light touch grossly intact throughout    Psych ??? appropriate affect   ??? oriented to person, place, time

## 2020-09-22 LAB — HIV RNA, QUANTITATIVE, PCR: HIV RNA QNT RSLT: NOT DETECTED

## 2020-10-06 NOTE — Unmapped (Signed)
[facility] Refill Coordination Note    Specialty Medication(s) to be Shipped:   Infectious Disease: Biktarvy    Other medication(s) to be shipped: levothyroxine, bactrim, valacyclovir and alendronate     Joan Burns, DOB: 1950-08-24  Phone: [phone] (home)       All above HIPAA information was verified with patient's caregiver, [name]     Was a translator used for this call? No    Completed refill call assessment today to schedule patient's medication shipment from the [facility] [phone]).  All relevant notes have been reviewed.     Specialty medication(s) and dose(s) confirmed: Regimen is correct and unchanged.   Changes to medications: Ruhani reports no changes at this time.  Changes to insurance: No  New side effects reported not previously addressed with a pharmacist or physician: None reported  Questions for the pharmacist: No    Confirmed patient received a Conservation officer, historic buildings and a Surveyor, mining with first shipment. The patient will receive a drug information handout for each medication shipped and additional FDA Medication Guides as required.       DISEASE/MEDICATION-SPECIFIC INFORMATION        N/A    SPECIALTY MEDICATION ADHERENCE     Medication Adherence    Patient reported X missed doses in the last month: 0  Specialty Medication: Biktarvy 50-200-25mg   Patient is on additional specialty medications: No  Patient is on more than two specialty medications: No        Were doses missed due to medication being on hold? No    Biktarvy 50-200-25 mg: 10 days of medicine on hand     REFERRAL TO PHARMACIST     Referral to the pharmacist: Not needed      [facility]     Shipping address confirmed in Epic.     Delivery Scheduled: Yes, Expected medication delivery date: 10/12/2020.     Medication will be delivered via UPS to the prescription address in Epic WAM.    Lorelei Pont [facility] Specialty Technician

## 2020-10-11 MED FILL — BIKTARVY 50 MG-200 MG-25 MG TABLET: ORAL | 30 days supply | Qty: 30 | Fill #2

## 2020-10-11 MED FILL — SULFAMETHOXAZOLE 400 MG-TRIMETHOPRIM 80 MG TABLET: ORAL | 30 days supply | Qty: 30 | Fill #3

## 2020-10-11 MED FILL — VALACYCLOVIR 500 MG TABLET: ORAL | 30 days supply | Qty: 60 | Fill #3

## 2020-10-12 NOTE — Unmapped (Signed)
Medical Case Management Discharge Note    Duration of Intervention: 1 minutes    Talked with Patient? NO    REASON FOR DISCHARGE:  Completed Care Plan Goals    EFFECTIVE DATE:  10/12/20    OUTSIDE REFERRALS MADE: No- will use social work as needed.        Bradly Bienenstock, LCSW, CHES  [facility] Lead Social Worker

## 2020-11-06 NOTE — Unmapped (Addendum)
[facility] Refill Coordination Note    Specialty Medication(s) to be Shipped:   Infectious Disease: Biktarvy    Other medication(s) to be shipped: alendronate       Joan Burns, DOB: August 31, 1950  Phone: [phone] (home)       All above HIPAA information was verified with patient's family member, daughter.     Was a Nurse, learning disability used for this call? denied interpreter    Completed refill call assessment today to schedule patient's medication shipment from the [facility] [phone]).  All relevant notes have been reviewed.     Specialty medication(s) and dose(s) confirmed: Regimen is correct and unchanged.   Changes to medications: Joan Burns reports no changes at this time.  Changes to insurance: No  New side effects reported not previously addressed with a pharmacist or physician: None reported  Questions for the pharmacist: No    Confirmed patient received a Conservation officer, historic buildings and a Surveyor, mining with first shipment. The patient will receive a drug information handout for each medication shipped and additional FDA Medication Guides as required.       DISEASE/MEDICATION-SPECIFIC INFORMATION        N/A    SPECIALTY MEDICATION ADHERENCE     Medication Adherence    Patient reported X missed doses in the last month: 0  Specialty Medication: biktarvy              Were doses missed due to medication being on hold? No    Unable to confirm quantity on hand    REFERRAL TO PHARMACIST     Referral to the pharmacist: Not needed      [facility]     Shipping address confirmed in Epic.     Delivery Scheduled: Yes, Expected medication delivery date: 10/20.     Medication will be delivered via Next Day Courier to the prescription address in Epic WAM.    Joan Burns   [facility] Specialty Technician

## 2020-11-08 MED FILL — BIKTARVY 50 MG-200 MG-25 MG TABLET: ORAL | 30 days supply | Qty: 30 | Fill #3

## 2020-11-08 MED FILL — ALENDRONATE 70 MG TABLET: ORAL | 28 days supply | Qty: 4 | Fill #1

## 2020-12-05 NOTE — Unmapped (Signed)
Pt's daughter called and states that she needs a generic excused note as a blanket work note so that whenever she needs to take off work to be the [employer], she can give her work a note already in hand for future appointments. Please contact daughter to inform her if this can be done. Thanks.

## 2020-12-07 NOTE — Unmapped (Signed)
[facility] Refill Coordination Note    Specialty Medication(s) to be Shipped:   Infectious Disease: Biktarvy    Other medication(s) to be shipped: alendronate  Generic bactrim ss  Valacyclovir       Joan Burns, DOB: Mar 29, 1950  Phone: [phone] (home)       All above HIPAA information was verified with patient's family member, daughter simba.     Was a Nurse, learning disability used for this call? No    Completed refill call assessment today to schedule patient's medication shipment from the [facility] [phone]).  All relevant notes have been reviewed.     Specialty medication(s) and dose(s) confirmed: Regimen is correct and unchanged.   Changes to medications: Adaleen reports no changes at this time.  Changes to insurance: No  New side effects reported not previously addressed with a pharmacist or physician: None reported  Questions for the pharmacist: No    Confirmed patient received a Conservation officer, historic buildings and a Surveyor, mining with first shipment. The patient will receive a drug information handout for each medication shipped and additional FDA Medication Guides as required.       DISEASE/MEDICATION-SPECIFIC INFORMATION        N/A    SPECIALTY MEDICATION ADHERENCE     Medication Adherence    Patient reported X missed doses in the last month: 0  Specialty Medication: Biktarvy              Were doses missed due to medication being on hold? No    Unable to confirm quantity on hand    REFERRAL TO PHARMACIST     Referral to the pharmacist: Not needed      [facility]     Shipping address confirmed in Epic.     Delivery Scheduled: Yes, Expected medication delivery date: 11/18.     Medication will be delivered via Same Day Courier to the prescription address in Epic WAM.    Westley Gambles   [facility] Specialty Technician

## 2020-12-08 MED FILL — ALENDRONATE 70 MG TABLET: ORAL | 28 days supply | Qty: 4 | Fill #2

## 2020-12-08 MED FILL — SULFAMETHOXAZOLE 400 MG-TRIMETHOPRIM 80 MG TABLET: ORAL | 30 days supply | Qty: 30 | Fill #4

## 2020-12-08 MED FILL — BIKTARVY 50 MG-200 MG-25 MG TABLET: ORAL | 30 days supply | Qty: 30 | Fill #4

## 2020-12-08 MED FILL — VALACYCLOVIR 500 MG TABLET: ORAL | 30 days supply | Qty: 60 | Fill #4

## 2020-12-19 DIAGNOSIS — B20 Human immunodeficiency virus [HIV] disease: Principal | ICD-10-CM

## 2020-12-19 NOTE — Unmapped (Addendum)
Attempted to contact patient by phone. Left discrete message for patient to call clinic at [phone].  I am calling to discuss:  1. Would ike to see if patient can come back in earlier than March 2023.    12/19/20 at 1400: [name] daughter and primary caregiver, [name], called me back and reports that currently [name] is living and working in [place] and needs to fly in to [place] to go to appointments with her mom. She has had some difficulty getting time off for these appointments. I had not realized this arrangement before. In light of this, I did ask her to reschedule appointment with me sooner, in mid-January, for me to check on patient and I would do my best to obtain interpreter. [name], ideally would need 1 week notice to be able to come to interpret for the appointment. If anything, she can interpret over the phone as well. I will await her message telling me that she has rescheduled the appointment and then I will put in a request for interpreter. She also mentioned that she needs a letter for her employer to allow her to be at the appointments, will keep that in mind moving forward.

## 2020-12-27 ENCOUNTER — Encounter: Admit: 2020-12-27 | Discharge: 2020-12-27 | Payer: BLUE CROSS/BLUE SHIELD

## 2020-12-27 ENCOUNTER — Ambulatory Visit: Admit: 2020-12-27 | Discharge: 2020-12-28 | Payer: BLUE CROSS/BLUE SHIELD

## 2020-12-27 DIAGNOSIS — A6000 Herpesviral infection of urogenital system, unspecified: Principal | ICD-10-CM

## 2020-12-27 DIAGNOSIS — N898 Other specified noninflammatory disorders of vagina: Principal | ICD-10-CM

## 2020-12-27 DIAGNOSIS — B20 Human immunodeficiency virus [HIV] disease: Principal | ICD-10-CM

## 2020-12-27 DIAGNOSIS — D259 Leiomyoma of uterus, unspecified: Principal | ICD-10-CM

## 2020-12-27 DIAGNOSIS — L290 Pruritus ani: Principal | ICD-10-CM

## 2020-12-27 MED ORDER — TRIAMCINOLONE ACETONIDE 0.1 % TOPICAL CREAM
INTRAMUSCULAR | 0 refills | 0.00000 days | Status: CP
Start: 2020-12-27 — End: ?

## 2020-12-27 MED ORDER — VALACYCLOVIR 1 GRAM TABLET
ORAL_TABLET | Freq: Every day | ORAL | 3 refills | 90.00000 days | Status: CP
Start: 2020-12-27 — End: 2020-12-28

## 2020-12-27 NOTE — Unmapped (Signed)
Pharmacy called clarification for RX, message sent to provider

## 2020-12-27 NOTE — Unmapped (Signed)
Outpatient Gynecology Note - Established Patient    ASSESSMENT AND PLAN     Genital herpes simplex, unspecified site  -  Discussed with the patient that she is at a higher risk of recurrence of HSV infections with her HIV diagnosis.  I recommend starting a daily antiviral to suppress that likelihood.    - Persistent itching and discomfort could be due to postherpetic symptoms.  At this time I will try a topical steroid first.  If persisting and truly bothersome can consider alternative treatments.    - valACYclovir (VALTREX) 1000 MG tablet; Take 1 tablet (1,000 mg total) by mouth daily for 1 day.    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   - Consider biopsy of the area if not improving given her HIV status.      Vaginal discharge  - Vaginitis Molecular Panel    HIV infection, unspecified symptom status (CMS-HCC)  - Discussed with the patient annual exam and pap smear q 3 years forever indicated with HIV.      Uterine leiomyoma, unspecified location  - Ambulatory referral to Gynecology  - Pt is asymptomatic.  Normal exam. No [place] or additional management/evaluation indicated at this time.     Return in about 1 year (around 12/27/2021) for Well woman exam or sooner if no resolution in the itching.    Tawni Carnes, PA-C    SUBJECTIVE     This 70 y.o. G5P5000 is an new [facility] patient who presents today with concerns regarding some perianal itching and pimples she's had.  In June she had some pimples, itching, discomfort and then some bleeding seen in the perianal area.  Went to her PCP and HSV 2 was diagnosed.  She was treated with valtrex at the time.  Not currently taking and medication.  Since then she occasionally has pimples but has this persistent itching in the buttocks/perianal area.      She also notes some vaginal itching too, but denies abnormal discharge or odor.    No vaginal bleeding.  She reports having a pap smear about a week ago at another office.      H/o uterine fibroid surgery in 2020.     The a [place] interpreter was used for the entirety of the visit.    OB History   Gravida Para Term Preterm AB Living   5 5 5          SAB IAB Ectopic Molar Multiple Live Births                    # Outcome Date GA Lbr Len/2nd Weight Sex Delivery Anes PTL Lv   5 Term            4 Term            3 Term            2 Term            1 Term                GYN History:   Menstrual cycles: N/A  Last Pap: Per patient had last week, no result available.     Past medical and surgical history was reviewed and updated.    Current Outpatient Medications   Medication Sig Dispense Refill   ??? alendronate (FOSAMAX) 70 MG tablet Take 1 tablet (70 mg total) by  mouth every seven (7) days. 4 tablet 11   ??? bictegrav-emtricit-tenofov ala (BIKTARVY) 50-200-25 mg tablet Take 1 tablet by mouth in the morning. 30 tablet 5   ??? calcium carbonate-vitamin D3 600 mg-10 mcg (400 unit) per tablet Take 1 tablet by mouth daily.     ??? levothyroxine (SYNTHROID) 50 MCG tablet Take 1 tablet (50 mcg total) by mouth daily. (Patient not taking: Reported on 09/15/2020) 90 tablet 1   ??? polyvinyl alcohol/povidone/PF (REFRESH CLASSIC, PF, OPHT) Apply to eye.     ??? sulfamethoxazole-trimethoprim (BACTRIM) 400-80 mg per tablet Take 1 tablet (80 mg of trimethoprim total) by mouth in the morning. 30 tablet 5   ??? triamcinolone (KENALOG) 0.1 % cream Apply to affected area twice a day for 1 month 30 g 0   ??? valACYclovir (VALTREX) 1000 MG tablet Take 1 tablet (1,000 mg total) by mouth daily for 1 day. 90 tablet 3     No current facility-administered medications for this visit.       Allergies   Allergen Reactions   ??? Tetanus-Diphtheria Toxoids-Td Rash       REVIEW OF SYSTEMS: See HPI; remaining balance of 10 systems are negative/non-contributory.    OBJECTIVE     BP 120/65  - Pulse 77  - Wt 67.9 kg (149 lb 12.8 oz)  - BMI 30.26 kg/m??   General: well-appearing and in NAD  Psych: pleasant and interactive, answers questions appropriately   Abdomen:  soft, NT, ND, no organomegaly  Ext genitalia:  NEFG, BUS negative.  No active herpetic lesions present.  Scarring noted perianally likely from HSV outbreak.  She also has darker pigmented patches on her buttocks that are nontender and not indurated but this is where she is itching.  Some lichenification present.    Vagina:  no gross lesions, scant milky slightly bubbly discharge present.    Cervix: No gross lesions, no CMT  Uterus:  normal size and non-tender  Adnexa: no tenderness or palpable masses    Antonietta Barcelona was present for the exam.     LABS

## 2020-12-28 DIAGNOSIS — B9689 Other specified bacterial agents as the cause of diseases classified elsewhere: Principal | ICD-10-CM

## 2020-12-28 DIAGNOSIS — B3731 Yeast vaginitis: Principal | ICD-10-CM

## 2020-12-28 DIAGNOSIS — N760 Acute vaginitis: Principal | ICD-10-CM

## 2020-12-28 MED ORDER — FLUCONAZOLE 150 MG TABLET
ORAL_TABLET | Freq: Once | ORAL | 0 refills | 1.00000 days | Status: CP
Start: 2020-12-28 — End: 2020-12-28

## 2020-12-28 MED ORDER — METRONIDAZOLE 500 MG TABLET
ORAL_TABLET | Freq: Two times a day (BID) | ORAL | 0 refills | 7.00000 days | Status: CP
Start: 2020-12-28 — End: 2021-01-04

## 2020-12-28 NOTE — Unmapped (Addendum)
Attempted to contact pt via [place] interpretor [phone]. No answer, generic VM left.      ----- Message from [facility], [place] sent at 12/28/2020  7:53 AM EST -----  Please call the patient with the assistance of a french interpreter.  Let her know that her vaginal culture showed both a yeast and bacterial infection.  I sent in treatment to her [facility] pharmacy on file.  Avoid any alcohol for the next 10 days.

## 2021-01-02 NOTE — Unmapped (Addendum)
Pt notified via french interpretor per message below. Pt verbalized understanding.       ----- Message from [facility] [place] sent at 12/28/2020  7:53 AM EST -----  Please call the patient with the assistance of a french interpreter.  Let her know that her vaginal culture showed both a yeast and bacterial infection.  I sent in treatment to her [facility] pharmacy on file.  Avoid any alcohol for the next 10 days.

## 2021-01-05 NOTE — Unmapped (Signed)
[facility] Refill Coordination Note    Specialty Medication(s) to be Shipped:   Infectious Disease: Biktarvy    Other medication(s) to be shipped: alendronate  Levothyroxine  Generic bactrim ss       Joan Burns, DOB: January 09, 1951  Phone: [phone] (home)       All above HIPAA information was verified with patient's family member, daughter Joan Burns.     Was a Nurse, learning disability used for this call? denied interpreter    Completed refill call assessment today to schedule patient's medication shipment from the [facility] [phone]).  All relevant notes have been reviewed.     Specialty medication(s) and dose(s) confirmed: Regimen is correct and unchanged.   Changes to medications: Joan Burns reports no changes at this time.  Changes to insurance: No  New side effects reported not previously addressed with a pharmacist or physician: None reported  Questions for the pharmacist: No    Confirmed patient received a Conservation officer, historic buildings and a Surveyor, mining with first shipment. The patient will receive a drug information handout for each medication shipped and additional FDA Medication Guides as required.       DISEASE/MEDICATION-SPECIFIC INFORMATION        N/A    SPECIALTY MEDICATION ADHERENCE     Medication Adherence    Patient reported X missed doses in the last month: 0  Specialty Medication: biktarvy              Were doses missed due to medication being on hold? No    Unable to confirm quantity on hand    REFERRAL TO PHARMACIST     Referral to the pharmacist: Not needed      Joan Burns     Shipping address confirmed in Epic.     Delivery Scheduled: Yes, Expected medication delivery date: 12/21.     Medication will be delivered via Next Day Courier to the prescription address in Epic WAM.    Joan Burns   [facility] Specialty Technician

## 2021-01-09 MED FILL — ALENDRONATE 70 MG TABLET: ORAL | 28 days supply | Qty: 4 | Fill #3

## 2021-01-09 MED FILL — LEVOTHYROXINE 50 MCG TABLET: ORAL | 90 days supply | Qty: 90 | Fill #1

## 2021-01-09 MED FILL — SULFAMETHOXAZOLE 400 MG-TRIMETHOPRIM 80 MG TABLET: ORAL | 30 days supply | Qty: 30 | Fill #5

## 2021-01-09 MED FILL — BIKTARVY 50 MG-200 MG-25 MG TABLET: ORAL | 30 days supply | Qty: 30 | Fill #5

## 2021-01-12 DIAGNOSIS — B20 Human immunodeficiency virus [HIV] disease: Principal | ICD-10-CM

## 2021-01-12 MED ORDER — BIKTARVY 50 MG-200 MG-25 MG TABLET
ORAL_TABLET | Freq: Every day | ORAL | 2 refills | 30 days | Status: CP
Start: 2021-01-12 — End: ?
  Filled 2021-02-06: qty 30, 30d supply, fill #0

## 2021-02-06 DIAGNOSIS — B20 Human immunodeficiency virus [HIV] disease: Principal | ICD-10-CM

## 2021-02-06 MED ORDER — SULFAMETHOXAZOLE 400 MG-TRIMETHOPRIM 80 MG TABLET
ORAL_TABLET | Freq: Every day | ORAL | 5 refills | 30 days | Status: CP
Start: 2021-02-06 — End: ?
  Filled 2021-02-06: qty 30, 30d supply, fill #0

## 2021-02-06 MED FILL — ALENDRONATE 70 MG TABLET: ORAL | 28 days supply | Qty: 4 | Fill #4

## 2021-02-06 NOTE — Unmapped (Signed)
[facility] Clinical Assessment & Refill Coordination Note    Joan Burns, DOB: [birth year]  Phone: [phone] (home)     All above HIPAA information was verified with patient.     Was a Nurse, learning disability used for this call? Yes, [place] Conservation officer, historic buildings [phone]). Patient language is appropriate in [facility]    Specialty Medication(s):   Infectious Disease: Biktarvy     Current Outpatient Medications   Medication Sig Dispense Refill   ??? alendronate (FOSAMAX) 70 MG tablet Take 1 tablet (70 mg total) by mouth every seven (7) days. 4 tablet 11   ??? bictegrav-emtricit-tenofov ala (BIKTARVY) 50-200-25 mg tablet Take 1 tablet by mouth in the morning. 30 tablet 2   ??? calcium carbonate-vitamin D3 600 mg-10 mcg (400 unit) per tablet Take 1 tablet by mouth daily.     ??? levothyroxine (SYNTHROID) 50 MCG tablet Take 1 tablet (50 mcg total) by mouth daily. (Patient not taking: Reported on 09/15/2020) 90 tablet 1   ??? polyvinyl alcohol/povidone/PF (REFRESH CLASSIC, PF, OPHT) Apply to eye.     ??? sulfamethoxazole-trimethoprim (BACTRIM) 400-80 mg per tablet Take 1 tablet (80 mg of trimethoprim total) by mouth in the morning. 30 tablet 5   ??? triamcinolone (KENALOG) 0.1 % cream Apply to affected area twice a day for 1 month 30 g 0     No current facility-administered medications for this visit.        Changes to medications: Jennavecia reports no changes at this time.    Allergies   Allergen Reactions   ??? Tetanus-Diphtheria Toxoids-Td Rash       Changes to allergies: No    SPECIALTY MEDICATION ADHERENCE     Biktarvy 50-200-25 mg: 3 days of medicine on hand       Medication Adherence    Patient reported X missed doses in the last month: 0  Specialty Medication: Biktarvy 50-200-25mg   Patient is on additional specialty medications: No  Any gaps in refill history greater than 2 weeks in the last 3 months: no  Demonstrates understanding of importance of adherence: yes  Informant: patient  Provider-estimated medication adherence level: good  Patient is at risk for Non-Adherence: No          Specialty medication(s) dose(s) confirmed: Regimen is correct and unchanged.     Are there any concerns with adherence? No    Adherence counseling provided? Not needed    CLINICAL MANAGEMENT AND INTERVENTION      Clinical Benefit Assessment:    Do you feel the medicine is effective or helping your condition? Yes    HIV ASSOCIATED LABS:     Lab Results   Component Value Date/Time    HIVRS Not Detected 09/15/2020 04:56 PM    HIVRS Detected (A) 07/20/2020 01:20 PM    HIVCP 12,564 (H) 07/20/2020 01:20 PM    ACD4 102 (L) 09/15/2020 04:56 PM    ACD4 76 (L) 07/20/2020 01:20 PM       Clinical Benefit counseling provided? Labs from 09/15/20 show evidence of clinical benefit    Adverse Effects Assessment:    Are you experiencing any side effects? No    Are you experiencing difficulty administering your medicine? No    Quality of Life Assessment:    How many days over the past month did your HIV  keep you from your normal activities? For example, brushing your teeth or getting up in the morning. 0    Have you discussed this with your provider? Not  needed    Acute Infection Status:    Acute infections noted within Epic:  No active infections  Patient reported infection: None    Therapy Appropriateness:    Is therapy appropriate and patient progressing towards therapeutic goals? Yes, therapy is appropriate and should be continued    DISEASE/MEDICATION-SPECIFIC INFORMATION      N/A    PATIENT SPECIFIC NEEDS     - Does the patient have any physical, cognitive, or cultural barriers? No    - Is the patient high risk? No    - Does the patient require a Care Management Plan? No       SHIPPING     Specialty Medication(s) to be Shipped:   Infectious Disease: Biktarvy    Other medication(s) to be shipped: smz-tmp 400-80mg  and alendronate 70mg      Changes to insurance: No    Delivery Scheduled: Yes, Expected medication delivery date: 02/06/21.     Medication will be delivered via Same Day Courier to the confirmed prescription address in [facility].    The patient will receive a drug information handout for each medication shipped and additional FDA Medication Guides as required.  Verified that patient has previously received a Conservation officer, historic buildings and a Surveyor, mining.    The patient or caregiver noted above participated in the development of this care plan and knows that they can request review of or adjustments to the care plan at any time.      All of the patient's questions and concerns have been addressed.    Roderic Palau   [facility] Specialty Pharmacist

## 2021-02-28 NOTE — Unmapped (Signed)
[facility] Refill Coordination Note    Specialty Medication(s) to be Shipped:   Infectious Disease: Biktarvy    Other medication(s) to be shipped: sulfa-trim and alendronate     Joan Burns, DOB: 1950-06-03  Phone: [phone] (home)       All above HIPAA information was verified with patient's family member, daughter.     Was a Nurse, learning disability used for this call? No    Completed refill call assessment today to schedule patient's medication shipment from the [facility] [phone]).  All relevant notes have been reviewed.     Specialty medication(s) and dose(s) confirmed: Regimen is correct and unchanged.   Changes to medications: Joan Burns reports no changes at this time.  Changes to insurance: No  New side effects reported not previously addressed with a pharmacist or physician: None reported  Questions for the pharmacist: No    Confirmed patient received a Conservation officer, historic buildings and a Surveyor, mining with first shipment. The patient will receive a drug information handout for each medication shipped and additional FDA Medication Guides as required.       DISEASE/MEDICATION-SPECIFIC INFORMATION        N/A    SPECIALTY MEDICATION ADHERENCE     Medication Adherence    Patient reported X missed doses in the last month: 0  Specialty Medication: BIKTARVY  Patient is on additional specialty medications: No              Were doses missed due to medication being on hold? No    Biktarvy 50-200-25 mg: 8 days of medicine on hand        REFERRAL TO PHARMACIST     Referral to the pharmacist: Not needed      [facility]     Shipping address confirmed in Epic.     Delivery Scheduled: Yes, Expected medication delivery date: 03/06/21.     Medication will be delivered via Next Day Courier to the prescription address in Epic WAM.    Unk Lightning   [facility] Specialty Technician

## 2021-03-05 DIAGNOSIS — M810 Age-related osteoporosis without current pathological fracture: Principal | ICD-10-CM

## 2021-03-05 MED ORDER — ALENDRONATE 70 MG TABLET
ORAL_TABLET | ORAL | 11 refills | 28 days | Status: CP
Start: 2021-03-05 — End: 2022-03-05
  Filled 2021-03-05: qty 4, 28d supply, fill #0

## 2021-03-05 MED FILL — SULFAMETHOXAZOLE 400 MG-TRIMETHOPRIM 80 MG TABLET: ORAL | 30 days supply | Qty: 30 | Fill #1

## 2021-03-05 MED FILL — BIKTARVY 50 MG-200 MG-25 MG TABLET: ORAL | 30 days supply | Qty: 30 | Fill #1

## 2021-03-05 NOTE — Unmapped (Signed)
Patient is requesting the following refill  Requested Prescriptions     Pending Prescriptions Disp Refills   ??? alendronate (FOSAMAX) 70 MG tablet 4 tablet 11     Sig: Take 1 tablet (70 mg total) by mouth every seven (7) days.       Recent Visits  Date Type Provider Dept   09/06/20 Office Visit Amie Portland, FNP [facility]   07/11/20 Office Visit Amie Portland, FNP [facility]   04/14/20 Office Visit Amie Portland, FNP [facility]   Showing recent visits within past 365 days with a meds authorizing provider and meeting all other requirements  Future Appointments  Date Type Provider Dept   03/28/21 Appointment Amie Portland, FNP [place] [facility]   04/16/21 Appointment Amie Portland, FNP [place] [facility]   Showing future appointments within next 365 days with a meds authorizing provider and meeting all other requirements

## 2021-03-28 ENCOUNTER — Ambulatory Visit: Admit: 2021-03-28 | Discharge: 2021-03-29 | Payer: BLUE CROSS/BLUE SHIELD

## 2021-03-28 DIAGNOSIS — Z09 Encounter for follow-up examination after completed treatment for conditions other than malignant neoplasm: Principal | ICD-10-CM

## 2021-03-28 DIAGNOSIS — E039 Hypothyroidism, unspecified: Principal | ICD-10-CM

## 2021-03-28 DIAGNOSIS — D649 Anemia, unspecified: Principal | ICD-10-CM

## 2021-03-28 DIAGNOSIS — Z1211 Encounter for screening for malignant neoplasm of colon: Principal | ICD-10-CM

## 2021-03-28 DIAGNOSIS — E785 Hyperlipidemia, unspecified: Principal | ICD-10-CM

## 2021-03-28 DIAGNOSIS — L290 Pruritus ani: Principal | ICD-10-CM

## 2021-03-28 DIAGNOSIS — A6000 Herpesviral infection of urogenital system, unspecified: Principal | ICD-10-CM

## 2021-03-28 DIAGNOSIS — B20 Human immunodeficiency virus [HIV] disease: Principal | ICD-10-CM

## 2021-03-28 LAB — LIPID PANEL
CHOLESTEROL/HDL RATIO SCREEN: 3.6 (ref 1.0–4.5)
CHOLESTEROL: 172 mg/dL (ref <=200)
HDL CHOLESTEROL: 48 mg/dL (ref 40–60)
LDL CHOLESTEROL CALCULATED: 99 mg/dL (ref 40–99)
NON-HDL CHOLESTEROL: 124 mg/dL (ref 70–130)
TRIGLYCERIDES: 126 mg/dL (ref 0–150)
VLDL CHOLESTEROL CAL: 25.2 mg/dL (ref 11–41)

## 2021-03-28 LAB — COMPREHENSIVE METABOLIC PANEL
ALBUMIN: 3.2 g/dL — ABNORMAL LOW (ref 3.4–5.0)
ALKALINE PHOSPHATASE: 89 U/L (ref 46–116)
ALT (SGPT): 14 U/L (ref 10–49)
ANION GAP: 7 mmol/L (ref 5–14)
AST (SGOT): 24 U/L (ref <=34)
BILIRUBIN TOTAL: 0.3 mg/dL (ref 0.3–1.2)
BLOOD UREA NITROGEN: 12 mg/dL (ref 9–23)
BUN / CREAT RATIO: 20
CALCIUM: 8.3 mg/dL — ABNORMAL LOW (ref 8.7–10.4)
CHLORIDE: 107 mmol/L (ref 98–107)
CO2: 26 mmol/L (ref 20.0–31.0)
CREATININE: 0.61 mg/dL
EGFR CKD-EPI (2021) FEMALE: >90 mL/min/{1.73_m2} (ref >=60)
GLUCOSE RANDOM: 122 mg/dL (ref 70–179)
POTASSIUM: 4.1 mmol/L (ref 3.4–4.8)
PROTEIN TOTAL: 8.5 g/dL — ABNORMAL HIGH (ref 5.7–8.2)
SODIUM: 140 mmol/L (ref 135–145)

## 2021-03-28 LAB — CBC
HEMATOCRIT: 29.3 % — ABNORMAL LOW (ref 34.0–44.0)
HEMOGLOBIN: 9.6 g/dL — ABNORMAL LOW (ref 11.3–14.9)
MEAN CORPUSCULAR HEMOGLOBIN CONC: 32.6 g/dL (ref 32.0–36.0)
MEAN CORPUSCULAR HEMOGLOBIN: 24.8 pg — ABNORMAL LOW (ref 25.9–32.4)
MEAN CORPUSCULAR VOLUME: 76.1 fL — ABNORMAL LOW (ref 77.6–95.7)
MEAN PLATELET VOLUME: 10.3 fL (ref 6.8–10.7)
PLATELET COUNT: 217 10*9/L (ref 150–450)
RED BLOOD CELL COUNT: 3.85 10*12/L — ABNORMAL LOW (ref 3.95–5.13)
RED CELL DISTRIBUTION WIDTH: 18.7 % — ABNORMAL HIGH (ref 12.2–15.2)
WBC ADJUSTED: 3.5 10*9/L — ABNORMAL LOW (ref 3.6–11.2)

## 2021-03-28 LAB — TSH: THYROID STIMULATING HORMONE: 6.242 u[IU]/mL — ABNORMAL HIGH (ref 0.550–4.780)

## 2021-03-28 MED ORDER — TRIAMCINOLONE ACETONIDE 0.1 % TOPICAL CREAM
0 refills | 0 days | Status: CP
Start: 2021-03-28 — End: ?
  Filled 2021-04-02: qty 30, 30d supply, fill #0

## 2021-03-28 MED ORDER — VALACYCLOVIR 1 GRAM TABLET
ORAL_TABLET | Freq: Every day | ORAL | 0 refills | 1 days | Status: CP
Start: 2021-03-28 — End: 2021-03-29
  Filled 2021-04-02: qty 1, 1d supply, fill #0

## 2021-03-28 NOTE — Unmapped (Signed)
Assessment and Plan:   Interpreter # [phone] used for this visit     Joan Burns was seen today for follow-up and wound check.    Diagnoses and all orders for this visit:    Follow-up exam    Pt reports that she continues to drink 3 nights out of the week but will not elaborate on the amount     PHQ-2 Score: 0  PHQ-9 Score:    Edinburgh Score:    Screening complete, no depression identified / no further action needed today    -     TSH  -     Lipid Panel  -     CBC  -     Comprehensive Metabolic Panel    Perianal itch  -     triamcinolone (KENALOG) 0.1 % cream; Apply to affected area twice a day for 1 month    Genital herpes simplex, unspecified site    Pt had appt with GYN 12/27/2020  She was treated for herpes outbreak (though no rash present)/anal itching with valtrex and kenalog - She reports that she never got this medication   Today, she has the same complaint/concerns   No outbreak is noted today   Will send in the same treatment as GYN previously ordered and pt should follow up with them if still symptomatic per their last note     -     valACYclovir (VALTREX) 1000 MG tablet; Take 1 tablet (1,000 mg total) by mouth daily for 1 day.  -     triamcinolone (KENALOG) 0.1 % cream; Apply to affected area twice a day for 1 month    HIV infection, unspecified symptom status (CMS-HCC)    Pt is followed by Infectious Disease   She had appt last week but missed it d/t transportation   She has rescheduled for 04/16/2021    Hypothyroidism, unspecified type    Pt reports that she is taking levothyroxine daily   She never returned for her repeat testing after last visit in 05/2020  Will recheck TSH today     Denies signs/symptoms of hypo/hyper thyroid (weight gain/loss, hair loss, heat/cold intolerance, tremors, irritability/mood swings, constipation)    -     TSH    Hyperlipidemia, unspecified hyperlipidemia type  -     Lipid Panel    Anemia, unspecified type  -     CBC    Screening for colon cancer  - Colorectal Cancer DNA + FIT; Future         Return for Next scheduled follow up.    Subjective:     HPI: Joan Burns is a 71 y.o. female here for   Chief Complaint   Patient presents with   ??? Follow-up   ??? Wound Check     Is having an issue with the same spots in her pelvic region between the vagina and anus-   :    Interpreter # [phone] used for this visit     Joan Burns was seen today for follow-up and wound check.    Diagnoses and all orders for this visit:    Follow-up exam    Pt reports that she continues to drink 3 nights out of the week but will not elaborate on the amount     PHQ-2 Score: 0  PHQ-9 Score:    Inocente Salles Score:    Screening complete, no depression identified / no further action needed today    -  TSH  -     Lipid Panel  -     CBC  -     Comprehensive Metabolic Panel    Perianal itch  -     triamcinolone (KENALOG) 0.1 % cream; Apply to affected area twice a day for 1 month    Genital herpes simplex, unspecified site    Pt had appt with GYN 12/27/2020  She was treated for herpes outbreak (though no rash present)/anal itching with valtrex and kenalog - She reports that she never got this medication   Today, she has the same complaint/concerns   No outbreak is noted today   Will send in the same treatment as GYN previously ordered and pt should follow up with them if still symptomatic per their last note     -     valACYclovir (VALTREX) 1000 MG tablet; Take 1 tablet (1,000 mg total) by mouth daily for 1 day.  -     triamcinolone (KENALOG) 0.1 % cream; Apply to affected area twice a day for 1 month    HIV infection, unspecified symptom status (CMS-HCC)    Pt is followed by Infectious Disease   She had appt last week but missed it d/t transportation   She has rescheduled for 04/16/2021    Hypothyroidism, unspecified type    Pt reports that she is taking levothyroxine daily   She never returned for her repeat testing after last visit in 05/2020  Will recheck TSH today     Denies signs/symptoms of hypo/hyper thyroid (weight gain/loss, hair loss, heat/cold intolerance, tremors, irritability/mood swings, constipation)    -     TSH    Hyperlipidemia, unspecified hyperlipidemia type  -     Lipid Panel    Anemia, unspecified type  -     CBC    Screening for colon cancer  -     Colorectal Cancer DNA + FIT; Future        ROS:   Review of Systems   Constitutional: Negative for activity change, appetite change, fatigue and unexpected weight change.   Respiratory: Negative for chest tightness and shortness of breath.    Cardiovascular: Negative for chest pain and palpitations.   Gastrointestinal: Negative for abdominal pain, constipation, diarrhea, nausea and vomiting.   Endocrine: Negative for cold intolerance and heat intolerance.   Genitourinary: Negative for genital sores.        Perianal itching    Skin: Negative for rash.   Neurological: Negative for dizziness, light-headedness and headaches.   Psychiatric/Behavioral: Negative for sleep disturbance. The patient is not nervous/anxious.         Review of systems negative unless otherwise noted as per HPI    Objective:     Visit Vitals  BP 132/60   Pulse 73   Temp 36.6 ??C (97.8 ??F)   Ht 149.9 cm (4' 11)   Wt 69.4 kg (153 lb)   SpO2 98%   BMI 30.90 kg/m??          03/28/21 0924   PainSc: 0-No pain        Physical Exam  Constitutional:       General: She is not in acute distress.     Appearance: Normal appearance. She is well-developed and well-groomed. She is not ill-appearing.   HENT:      Head: Normocephalic and atraumatic.      Mouth/Throat:      Lips: Pink.      Mouth: Mucous membranes are moist.  Eyes:      Conjunctiva/sclera: Conjunctivae normal.   Cardiovascular:      Rate and Rhythm: Normal rate and regular rhythm.      Heart sounds: Normal heart sounds, S1 normal and S2 normal.   Pulmonary:      Effort: Pulmonary effort is normal.      Breath sounds: Normal breath sounds and air entry.   Abdominal:      General: Bowel sounds are normal. Palpations: Abdomen is soft.      Tenderness: There is no abdominal tenderness.   Genitourinary:     Labia:         Right: No lesion.       Comments: No perianal or anal lesion seen   Extensive scarring is noted in vaginal region   Skin:     General: Skin is warm and dry.   Neurological:      Mental Status: She is alert and oriented to person, place, and time.   Psychiatric:         Attention and Perception: Attention and perception normal.         Mood and Affect: Mood and affect normal.         Speech: Speech normal.         Behavior: Behavior normal. Behavior is cooperative.         Thought Content: Thought content normal.          [facility]:     Medication adherence and barriers to the treatment plan have been addressed. Opportunities to optimize healthy behaviors have been discussed. Patient / caregiver voiced understanding.

## 2021-03-29 DIAGNOSIS — E039 Hypothyroidism, unspecified: Principal | ICD-10-CM

## 2021-03-29 MED ORDER — LEVOTHYROXINE 50 MCG TABLET
ORAL_TABLET | Freq: Every day | ORAL | 1 refills | 90 days
Start: 2021-03-29 — End: 2022-03-30

## 2021-03-29 NOTE — Unmapped (Signed)
NOT SURE IF NEEDS DOSE ADJUSTMENT ???    Patient is requesting the following refill  Requested Prescriptions     Pending Prescriptions Disp Refills   ??? levothyroxine (SYNTHROID) 50 MCG tablet 90 tablet 1     Sig: Take 1 tablet (50 mcg total) by mouth daily.       Recent Visits  Date Type Provider Dept   03/28/21 Office Visit Amie Portland, FNP [facility]   09/06/20 Office Visit Amie Portland, FNP [facility]   07/11/20 Office Visit Amie Portland, FNP [facility]   04/14/20 Office Visit Amie Portland, FNP [facility]   Showing recent visits within past 365 days with a meds authorizing provider and meeting all other requirements  Future Appointments  Date Type Provider Dept   04/16/21 Appointment Amie Portland, FNP [facility]   Showing future appointments within next 365 days with a meds authorizing provider and meeting all other requirements       Labs:   TSH:   TSH (uIU/mL)   Date Value   03/28/2021 6.242 (H)

## 2021-03-29 NOTE — Unmapped (Signed)
[facility] Refill Coordination Note    Specialty Medication(s) to be Shipped:   Infectious Disease: Biktarvy    Other medication(s) to be shipped: levothyroxine, sulfa-trim, alendronate, triamcinolone cream, valacyclovir      Joan Burns, DOB: 11-19-1950  Phone: [phone] (home)       All above HIPAA information was verified with patient.     Was a Nurse, learning disability used for this call? No    Completed refill call assessment today to schedule patient's medication shipment from the [facility] [phone]).  All relevant notes have been reviewed.     Specialty medication(s) and dose(s) confirmed: Regimen is correct and unchanged.   Changes to medications: Leasa reports no changes at this time.  Changes to insurance: No  New side effects reported not previously addressed with a pharmacist or physician: None reported     Questions for the pharmacist: No    Confirmed patient received a Conservation officer, historic buildings and a Surveyor, mining with first shipment. The patient will receive a drug information handout for each medication shipped and additional FDA Medication Guides as required.       DISEASE/MEDICATION-SPECIFIC INFORMATION        N/A    SPECIALTY MEDICATION ADHERENCE     Medication Adherence    Patient reported X missed doses in the last month: 0  Specialty Medication: BIKTARVY 50-200-25 mg  Patient is on additional specialty medications: No              Were doses missed due to medication being on hold? No    Biktarvy 50-200-25 mg: 6 days of medicine on hand        REFERRAL TO PHARMACIST     Referral to the pharmacist: Not needed      [facility]     Shipping address confirmed in Epic.     Delivery Scheduled: Yes, Expected medication delivery date: 04/02/21.     Medication will be delivered via Same Day Courier to the prescription address in Epic WAM.    Unk Lightning   [facility] Specialty Technician

## 2021-04-02 MED FILL — BIKTARVY 50 MG-200 MG-25 MG TABLET: ORAL | 30 days supply | Qty: 30 | Fill #2

## 2021-04-02 MED FILL — ALENDRONATE 70 MG TABLET: ORAL | 28 days supply | Qty: 4 | Fill #1

## 2021-04-02 MED FILL — SULFAMETHOXAZOLE 400 MG-TRIMETHOPRIM 80 MG TABLET: ORAL | 30 days supply | Qty: 30 | Fill #2

## 2021-04-03 DIAGNOSIS — E039 Hypothyroidism, unspecified: Principal | ICD-10-CM

## 2021-04-03 MED ORDER — LEVOTHYROXINE 100 MCG TABLET
ORAL_TABLET | Freq: Every day | ORAL | 2 refills | 90 days | Status: CP
Start: 2021-04-03 — End: 2022-04-03

## 2021-04-03 MED ORDER — LEVOTHYROXINE 50 MCG TABLET
ORAL_TABLET | Freq: Every day | ORAL | 1 refills | 90 days
Start: 2021-04-03 — End: 2022-04-04

## 2021-04-04 NOTE — Unmapped (Signed)
Good afternoon Joan Burns (please call this patient dtr or use interpreter to discuss labs with her) - [employer] [phone] and asking for a [place] interpreter (language equivalent to [place]).    I have reviewed your labs     Your TSH level is high - We need to adjust your thyroid medication   I will be calling in a prescription for that you will take daily   I will call the new prescription in to the pharmacy on file   We will need to recheck your TSH level in 6-8 weeks to see if this dose is working   Please call the office to schedule this appt for lab only     You appear to have a slight Iron def anemia -   I do think that you could benefit from a daily iron supp (over the counter) - Take one daily - Be sure to take a stool softener (such as Colace) and drink plenty of water as iron can be constipating   Try to eat foods high in Iron such as beans, beef, liver, chicken, peas, broccoli, green leafy vegetables, eggs  Also, try to eat fruits which are high in vitamin C to aid in iron absorption - Vitamin B12 also aids in??iron??absorption, liver is a rich source of vitamin B12  Try to consume less foods/drinks that stop you absorbing iron, like calcium, tea, coffee and wine    Rosey Bath

## 2021-04-16 ENCOUNTER — Ambulatory Visit: Admit: 2021-04-16 | Discharge: 2021-04-17 | Payer: BLUE CROSS/BLUE SHIELD | Attending: Family | Primary: Family

## 2021-04-16 ENCOUNTER — Ambulatory Visit: Admit: 2021-04-16 | Discharge: 2021-04-17 | Payer: BLUE CROSS/BLUE SHIELD

## 2021-04-16 DIAGNOSIS — M25562 Pain in left knee: Principal | ICD-10-CM

## 2021-04-16 DIAGNOSIS — M25561 Pain in right knee: Principal | ICD-10-CM

## 2021-04-16 DIAGNOSIS — K5900 Constipation, unspecified: Principal | ICD-10-CM

## 2021-04-16 DIAGNOSIS — B20 Human immunodeficiency virus [HIV] disease: Principal | ICD-10-CM

## 2021-04-16 DIAGNOSIS — E039 Hypothyroidism, unspecified: Principal | ICD-10-CM

## 2021-04-16 LAB — CBC W/ AUTO DIFF
BASOPHILS ABSOLUTE COUNT: 0 10*9/L (ref 0.0–0.1)
BASOPHILS RELATIVE PERCENT: 0.5 %
EOSINOPHILS ABSOLUTE COUNT: 0.3 10*9/L (ref 0.0–0.5)
EOSINOPHILS RELATIVE PERCENT: 9.4 %
HEMATOCRIT: 29 % — ABNORMAL LOW (ref 34.0–44.0)
HEMOGLOBIN: 9.4 g/dL — ABNORMAL LOW (ref 11.3–14.9)
LYMPHOCYTES ABSOLUTE COUNT: 1.5 10*9/L (ref 1.1–3.6)
LYMPHOCYTES RELATIVE PERCENT: 41.2 %
MEAN CORPUSCULAR HEMOGLOBIN CONC: 32.3 g/dL (ref 32.0–36.0)
MEAN CORPUSCULAR HEMOGLOBIN: 24 pg — ABNORMAL LOW (ref 25.9–32.4)
MEAN CORPUSCULAR VOLUME: 74.2 fL — ABNORMAL LOW (ref 77.6–95.7)
MEAN PLATELET VOLUME: 9 fL (ref 6.8–10.7)
MONOCYTES ABSOLUTE COUNT: 0.4 10*9/L (ref 0.3–0.8)
MONOCYTES RELATIVE PERCENT: 10.4 %
NEUTROPHILS ABSOLUTE COUNT: 1.4 10*9/L — ABNORMAL LOW (ref 1.8–7.8)
NEUTROPHILS RELATIVE PERCENT: 38.5 %
NUCLEATED RED BLOOD CELLS: 0 /100{WBCs} (ref <=4)
PLATELET COUNT: 215 10*9/L (ref 150–450)
RED BLOOD CELL COUNT: 3.9 10*12/L — ABNORMAL LOW (ref 3.95–5.13)
RED CELL DISTRIBUTION WIDTH: 18.2 % — ABNORMAL HIGH (ref 12.2–15.2)
WBC ADJUSTED: 3.7 10*9/L (ref 3.6–11.2)

## 2021-04-16 LAB — LYMPH MARKER LIMITED,FLOW
ABSOLUTE CD3 CNT: 1155 {cells}/uL (ref 915–3400)
ABSOLUTE CD4 CNT: 120 {cells}/uL — ABNORMAL LOW (ref 510–2320)
ABSOLUTE CD8 CNT: 1020 {cells}/uL (ref 180–1520)
CD3% (T CELLS): 77 % (ref 61–86)
CD4% (T HELPER): 8 % — ABNORMAL LOW (ref 34–58)
CD4:CD8 RATIO: 0.1 — ABNORMAL LOW (ref 0.9–4.8)
CD8% T SUPPRESR: 68 % — ABNORMAL HIGH (ref 12–38)

## 2021-04-16 LAB — TSH: THYROID STIMULATING HORMONE: 6.729 u[IU]/mL — ABNORMAL HIGH (ref 0.550–4.780)

## 2021-04-16 MED ORDER — BIKTARVY 50 MG-200 MG-25 MG TABLET
ORAL_TABLET | Freq: Every day | ORAL | 6 refills | 30 days | Status: CP
Start: 2021-04-16 — End: ?
  Filled 2021-04-27: qty 30, 30d supply, fill #0

## 2021-04-16 MED ORDER — SULFAMETHOXAZOLE 400 MG-TRIMETHOPRIM 80 MG TABLET
ORAL_TABLET | Freq: Every day | ORAL | 5 refills | 30 days | Status: CP
Start: 2021-04-16 — End: ?
  Filled 2021-04-27: qty 30, 30d supply, fill #0

## 2021-04-16 NOTE — Unmapped (Addendum)
COVID Education:  Make sure you perform good hand washing (lasting 20 seconds), continue to social distance and limit close personal contact (which may include new sexual partners or having multiple partners during this period).  Try to isolate at home but please find ways to keep in touch with those close to you, such as meeting up with them electronically or socially distanced, and the ability to go outdoors alone or separated from others  If you become ill with fever, respiratory illness, sudden loss of taste and smell, stomach issues, diarrhea, nausea, vomiting - contact clinic for further instructions.  You should go to the emergency department if you develop systems such as shortness of breath, confusion, lightheadedness when standing, high fever.   Here is some information about HIV and CoVid vaccines: [email or web address]  If you're interested in receiving the CoVid vaccine when you're eligible, here are some resources for you to check and make an appointment:  Your local health department   [email or web address] through [facility]  [email or web address]  [facility] (if you are an established patient with them)  [email or web address]    URGENT CARE  Please call ahead to speak with the nursing staff if you are in need of an urgent appointment.       MEDICATIONS  For refills please contact your pharmacy and ask them to electronically send or fax the request to the clinic.   Please bring all medications in original bottles to every appointment.    [facility] (formerly [facility]) or [employer] Eligibility (required even if you do not receive medication through [facility])  Please remember to renew your Juanell Fairly eligibility during renewal periods which occur twice a year: January-March and July-September.     The following are needed for each renewal:   - [facility] Identification (if you don't have one, then a bill with your name and address in [place])   - proof of income (award letter, W-2, or last three check stubs)   If you are unable to come in for renewal, let us know if we can mail, fax or e-mail paperwork to you.   [facility]MAP Contact: [phone].     Lab info:  Your most recent CD4 T-cell counts and viral loads are below. Here are a few things to keep in mind when looking at your numbers:  Our goal is to get your virus to be undetectable and keep it undetectable. If the virus is undetectable you are much more likely to stay healthy.  We consider your viral load to be undetectable if it says <40 or if it says Not detected.  For most people, we're checking CD4 counts every other visit (once or twice a year, or sometimes even less).  It's normal for your CD4 count to be different from visit to visit.   You can help by taking your medications at about the same time, every single day. If you're having trouble with taking your medications, it's important to let us know.    Lab Results   Component Value Date    ACD4 102 (L) 09/15/2020    CD4 6 (L) 09/15/2020    HIVCP 12,564 (H) 07/20/2020    HIVRS Not Detected 09/15/2020        Please note that your laboratory and other results may be visible to you in real time, possibly before they reach your provider. Please allow 48 hours for clinical interpretation of these results. Importantly, even if a result is flagged as abnormal, it may not be one  that impacts your health.    It was nice to have a visit with you today!  Follow-up information:  Use Miralax as directed and Colace as directed for constipation  Tylenol ES 1-2 times a day for knee pain.  Take OTC iron supplement daily.      Provider today:  Varney Daily, FNP-BC      ID CLINIC address:   [facility] at [facility]  [address]  [place], [place] [address]    Contact information:    The ID clinic phone number is [phone]   The ID clinic fax number is [phone]  For urgent issues on nights and weekends: Call the ID Physician on-call through the [facility] Operator at [phone].    Please sign up for My [place] Chart - This is a great way to review your labs and track your appointments    Please try to arrive 30 minutes BEFORE your scheduled appointment time!  This will give you time to fill out any front desk paperwork needed for your visit, and allow you to be seen as close to your scheduled appointment time as possible.

## 2021-04-16 NOTE — Unmapped (Signed)
Interpreter used: [email or web address]   ID:

## 2021-04-16 NOTE — Unmapped (Signed)
[facility]  [address]  [place], [place]  [address]  P [phone]  F [phone]     Primary care provider: Herschell Dimes, FNP    Assessment/Plan:      HIV (dx'd 2015, nadir CD4 76/4% in 07/20/20)  - chronic, severe  Patient diagnosed with HIV in 2015 as part of pre-op work up for myomectomy. Per report from daughter, patient had declined treatment and her daughters through the years had tried to get her into care but was unable to, both here in [place] and in [place]. They were unable to establish care for her in [place] because her Medicaid was in [place].    Patient speaks Tshiluba or Luba-Kas[place], a [place] language, that can be accessed by [employer] (via telephone [phone]) if an appointment is made with them. Patient actually speaks a combination of Swahili and Tshiluba but prefers interpretation in [place]. Video interpretation does not include this language and patient does not fully understand [place] interpreter but can be used if preferred interpretation cannot be secured. Had scheduled [place] interpreter for prior appointment but patient unable to come for appointment and rescheduled for today. Unable to secure interpreter on short notice. [place] interpreter used and patient was okay to do visit with [place] interpretation. Patient is unaccompanied.    Overall doing well. Current regimen: Biktarvy (BIC/FTC/TAF)  Misses doses of ARVs never    Med access via Medicaid  CD4 count  102/6% after starting Biktarvy , on Bactrim SS daily. Repeat CD4 today. Will stop Bactrim if continues to be virally suppressed.  Discussed ARV adherence and taking ARVs with food    Lab Results   Component Value Date    ACD4 102 (L) 09/15/2020    CD4 6 (L) 09/15/2020    HIVRS Not Detected 09/15/2020    HIVCP 12,564 (H) 07/20/2020     CD4, HIV RNA, and safety labs (full return panel)  Continue current therapy On Biktarvy and Bactrim SS, tolerating well. Refills provided.  Discussed importance of ARV adherence       HIV retinopathy  -  chronic  - not addressed at this visit  Diagnosed by Ophthalmology, Dr. Larry Sierras  Incidental finding of CWS when patient being evaluated for cataracts.  Daughter reports that patient has not complained of vision changes prior to this diagnosis.  Continue close follow up with Ophthalmology, next appointment due 06/2021  Patient virally suppressed as of last viral load in 08/2020.      Constipation x 4 days - new  Usually has BM 2 times a day. Has not been able to have BM in last 4 days.  Denies any changes in diet. Drinks about 4L of water a day.  Tried using hot water enemas to induce BM without relief.  Discussed importance of drinking lots of fluids.  Recommended taking Miralax powder daily to help with having a BM.  Since she will be taking iron supplement moving forward per recommendation of PCP, recommended using Colace OTC daily.  Patient will monitor symptoms and follow up with PCP.  Escorted patient to [facility] pharmacy to assist with finding the correct medication.  Instructions on how to take medications discussed while [place] interpreter on ipad. [name] daughter is also flying in today so she can help with the OTC instructions.      Bilateral knee pain with walking recently - new  Patient has new job with [employer] standing long hours.  Has not tried to take any medication.  Has tried to massage knees with Vick's without much relief.  Suspect arthritis.  Recommend patient take Tylenol ES 500mg , 2 tablets 1-2 times a day and see if this helps with pain.  If persists or worsens, should see PCP.      HSV-2 infection/Perianal lesions  - acute, self-limited  Reports that lesion started by itching, which became a vesicle and then because of itching, the lesions opened. Watery, clear discharge has been draining since then, leasions seems to be getting bigger. Not experiencing pain, but is itchy.   07/11/20 Evaluated by PCP with surface swab revealing mixed gram positive organisms. Started on Bactrim SS BID.  Dr. Matt Holmes in to evaluate patient along with myself. Skin erosions are characteristic of HSV infection that may also be superinfected with bacteria.  Patient seen by GYN and PCP and given Valtrex 1000mg  daily for HSV suppression. Patient states she is taking this currently.      Uterine leiomyoma - chronic  Had uterine surgery in 2014 per patient report  Seen by GYN 12/27/20 and no further evaluation needed at this time other than routine well woman exam.       Hypothyroidism - chronic, stable  Managed by PCP      Alcohol Abuse - chronic  Drinks 5 beers or wine a day  Since being on antibiotics, patient drinks 1-2 drinks daily.      Reported history of Bell's Palsy and Shingles outbreak      Sexual health & secondary prevention  -  abstinent  Not in relationship. Last sexual encounter >5 years ago  She  is abstinent  She does not routinely discuss HIV status with partner(s).  Has children and post menopausal .    Lab Results   Component Value Date    RPR Nonreactive 07/20/2020    RPR Nonreactive 06/29/2020     GC/CT NAATs - not being checked routinely for this patient  RPR - repeat 12 months after prior, due 06/2021      Health maintenance  - chronic, acutely exacerbated  PCP: Ali Lowe, FNP     Oral health  She  unknown  have a dentist. Last dental exam unknown.    Eye health  She does  use corrective lenses. Last eye exam followed by Opthalmology.    Metabolic conditions  Wt Readings from Last 5 Encounters:   04/16/21 70.3 kg (155 lb)   03/28/21 69.4 kg (153 lb)   12/27/20 67.9 kg (149 lb 12.8 oz)   09/15/20 67.1 kg (148 lb)   09/06/20 65.8 kg (145 lb)     Lab Results   Component Value Date    CREATININE 0.61 03/28/2021    GLUCOSEU Negative 07/20/2020    GLU 122 03/28/2021    ALT 14 03/28/2021    ALT 8 (L) 09/15/2020    ALT 12 07/20/2020     # Kidney health - creatinine today  # Bone health - defer mgm't to PCP, on Fosamax  # Diabetes assessment - random glucose today  # NAFLD assessment - monitor over time    Communicable diseases  Lab Results   Component Value Date    QFTTBGOLD Negative 06/29/2020    HEPAIGG Reactive (A) 07/20/2020    HEPBSAB Reactive (A) 07/20/2020    HEPCAB Reactive (A) 04/14/2020    HCVRNA Not Detected 04/14/2020     # TB screening - no longer needed; negative IGRA, low risk  # Hepatitis screening -  as noted:  See  above  # MMR screening - not assessed    Cancer screening  No results found for: PSASCRN, PSA, PAP, FINALDX  # Cervical -  Defer management to GYN  # Breast -  Defer management to GYN    # Anorectal -  will disucss  # Colorectal -  Defer to PCP  # Liver - no screening indicated  # Lung - screening not indicated    Cardiovascular disease  Lab Results   Component Value Date    CHOL 172 03/28/2021    HDL 48 03/28/2021    LDL 99 03/28/2021    NONHDL 124 03/28/2021    TRIG 126 03/28/2021     # The 10-year ASCVD risk score (Arnett DK, et al., 2019) is: 8.1%  - is not taking aspirin   - is not taking statin  - BP control good  - never smoker  # AAA screening - no indication for screening    Immunization History   Administered Date(s) Administered    COVID-19 VACCINE,MRNA(MODERNA)(PF) 10/06/2020, 11/03/2020     Immunizations today - Patient received primary CoVid series and open to CoVid BV booster today. However, booster not available in clinic at this time. Patient declined flu vaccine today. Amenable to RTC in 1 month for CoVid booster.    I personally spent 50 minutes face-to-face and non-face-to-face in the care of this patient, which includes all pre, intra, and post visit time on the date of service.   Time for translation extended out our discussions.      Disposition  Next appointment: 4 weeks, Tsh[place]/ [place] interpreter requested for this visit      To do @ next RTC  CoVid booster, coming back in 4 weeks - nurse visit.    Varney Daily, FNP-BC  [facility] Infect[facility] Diseases [facility] at [facility]  [address], [place], [place] [address]    Phone: [phone]   Fax: [phone]             Subjective      Chief Complaint   HIV follow up    HPI  In addition to details in A&P above:    Daughter lives in [place] at this time and will travel back and forth occasionally to help with health care for patient. Patient has 3 daughters but Joan Burns is her primary caregiver out of her daughters.  Denies any fever, chills, nausea, vomiting, rash, urinary complaints, diarrhea  Feeling good overall, denies any side effects from medications      Past Medical History:   Diagnosis Date    Herpes simplex infection of perianal skin     HIV disease (CMS-HCC)     Other osteoporosis without current pathological fracture     Other specified hypothyroidism        Social History  Background - From [place]. Works at [employer] at this time. Currently lives in [place], [place].    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 All others negative.      Medications and Allergies  She has a current medication list which includes  the following prescription(s): alendronate, biktarvy, calcium carbonate-vitamin d3, levothyroxine, polyvinyl alcohol/povidone/pf, sulfamethoxazole-trimethoprim, triamcinolone, and valacyclovir.    Allergies: Tetanus-diphtheria toxoids-td      Family History  Her family history is not on file.          Objective:      BP 133/64 (BP Site: L Arm, BP Position: Sitting, BP Cuff Size: Large)  - Pulse 74  - Temp 36.5 ??C (97.7 ??F) (Temporal)  - Ht 149.9 cm (4' 11.02)  - Wt 70.3 kg (155 lb)  - BMI 31.29 kg/m??   Wt Readings from Last 3 Encounters:   04/16/21 70.3 kg (155 lb)   03/28/21 69.4 kg (153 lb)   12/27/20 67.9 kg (149 lb 12.8 oz)       Const looks well and attentive, alert, appropriate   Eyes sclerae anicteric, noninjected OU   ENT no thrush, leukoplakia or oral lesions   Lymph no cervical or supraclavicular LAD   CV RRR. No murmurs. No rub or gallop. S1/S2.   Lungs CTAB ant/post, normal work of breathing   GI Soft, no organomegaly. NTND. NABS.   GU deferred   Rectal deferred   Skin no petechiae, ecchymoses or obvious rashes on clothed exam   MSK normal ROM throughout and bilateral knees have pain with certain movements to opposition. Strength 5/5 and equal bilaterally   Neuro Grossly intact   Psych Appropriate affect. Eye contact good. Linear thoughts. Fluent speech.     Laboratory Data  Reviewed in Epic today, using Synopsis and Chart Review filters.    Lab Results   Component Value Date    CREATININE 0.61 03/28/2021    QFTTBGOLD Negative 06/29/2020    HEPCAB Reactive (A) 04/14/2020    HCVRNA Not Detected 04/14/2020    CHOL 172 03/28/2021    HDL 48 03/28/2021    LDL 99 03/28/2021    NONHDL 124 03/28/2021    TRIG 126 03/28/2021

## 2021-04-17 LAB — HIV RNA, QUANTITATIVE, PCR: HIV RNA QNT RSLT: NOT DETECTED

## 2021-04-18 NOTE — Unmapped (Signed)
Please reach out to pt and make sure that she increase her levothyroxine dosage after her last TSH 2 weeks ago

## 2021-04-24 NOTE — Unmapped (Signed)
Called pt and spoke to her daughter to review labs and check to see if she is taking her medication as prescribed  Pt daughter stated she has not took her levothyroxine since June, she is requesting refills to be sent using [place] shared services   Please advise    Labs reviewed with daughter

## 2021-04-24 NOTE — Unmapped (Signed)
Pt daughter verbalized understanding of test results, pt has not been taking medication as prescribed

## 2021-04-25 DIAGNOSIS — L290 Pruritus ani: Principal | ICD-10-CM

## 2021-04-25 DIAGNOSIS — A6000 Herpesviral infection of urogenital system, unspecified: Principal | ICD-10-CM

## 2021-04-25 MED ORDER — LEVOTHYROXINE 100 MCG TABLET
ORAL_TABLET | Freq: Every day | ORAL | 2 refills | 90 days | Status: CP
Start: 2021-04-25 — End: 2022-04-25
  Filled 2021-04-27: qty 90, 90d supply, fill #0

## 2021-04-25 MED ORDER — TRIAMCINOLONE ACETONIDE 0.1 % TOPICAL CREAM
0 refills | 0 days
Start: 2021-04-25 — End: ?

## 2021-04-25 MED ORDER — VALACYCLOVIR 1 GRAM TABLET
ORAL_TABLET | Freq: Every day | ORAL | 0 refills | 1 days
Start: 2021-04-25 — End: 2021-04-26

## 2021-04-25 NOTE — Unmapped (Signed)
[facility] Refill Coordination Note    Specialty Medication(s) to be Shipped:   Infectious Disease: Biktarvy    Other medication(s) to be shipped: levothyroxine, sulfa-trim, alendronate, triamcinolone cream, valacyclovir      Joan Burns, DOB: Jun 13, 1950  Phone: [phone] (home)       All above HIPAA information was verified with patient.     Was a Nurse, learning disability used for this call? No    Completed refill call assessment today to schedule patient's medication shipment from the [facility] [phone]).  All relevant notes have been reviewed.     Specialty medication(s) and dose(s) confirmed: Regimen is correct and unchanged.   Changes to medications: Joan Burns reports no changes at this time.  Changes to insurance: No  New side effects reported not previously addressed with a pharmacist or physician: None reported     Questions for the pharmacist: No    Confirmed patient received a Conservation officer, historic buildings and a Surveyor, mining with first shipment. The patient will receive a drug information handout for each medication shipped and additional FDA Medication Guides as required.       DISEASE/MEDICATION-SPECIFIC INFORMATION        N/A    SPECIALTY MEDICATION ADHERENCE     Medication Adherence    Patient reported X missed doses in the last month: 0  Specialty Medication: biktarvy  Patient is on additional specialty medications: No  Patient is on more than two specialty medications: No  Any gaps in refill history greater than 2 weeks in the last 3 months: no  Demonstrates understanding of importance of adherence: yes              Were doses missed due to medication being on hold? No    Biktarvy 50-200-25 mg: 7  days of medicine on hand        REFERRAL TO PHARMACIST     Referral to the pharmacist: Not needed      [facility]     Shipping address confirmed in Epic.     Delivery Scheduled: Yes, Expected medication delivery date: 04/27/21 .     Medication will be delivered via Same Day Courier to the prescription address in Epic WAM.    Joan Burns   [facility] Specialty Technician

## 2021-04-26 MED ORDER — TRIAMCINOLONE ACETONIDE 0.1 % TOPICAL CREAM
0 refills | 0 days
Start: 2021-04-26 — End: ?

## 2021-04-26 MED ORDER — VALACYCLOVIR 1 GRAM TABLET
ORAL_TABLET | Freq: Every day | ORAL | 0 refills | 1.00000 days | Status: CN
Start: 2021-04-26 — End: 2021-04-27

## 2021-04-27 DIAGNOSIS — L290 Pruritus ani: Principal | ICD-10-CM

## 2021-04-27 DIAGNOSIS — B009 Herpesviral infection, unspecified: Principal | ICD-10-CM

## 2021-04-27 DIAGNOSIS — A6000 Herpesviral infection of urogenital system, unspecified: Principal | ICD-10-CM

## 2021-04-27 MED ORDER — TRIAMCINOLONE ACETONIDE 0.1 % TOPICAL CREAM
0 refills | 0 days
Start: 2021-04-27 — End: ?

## 2021-04-27 MED ORDER — VALACYCLOVIR 1 GRAM TABLET
ORAL_TABLET | Freq: Every day | ORAL | 5 refills | 30 days | Status: CP
Start: 2021-04-27 — End: 2021-10-24
  Filled 2021-05-03: qty 30, 30d supply, fill #0

## 2021-04-27 MED FILL — ALENDRONATE 70 MG TABLET: ORAL | 28 days supply | Qty: 4 | Fill #2

## 2021-04-29 DIAGNOSIS — A6000 Herpesviral infection of urogenital system, unspecified: Principal | ICD-10-CM

## 2021-04-29 DIAGNOSIS — L290 Pruritus ani: Principal | ICD-10-CM

## 2021-04-29 MED ORDER — TRIAMCINOLONE ACETONIDE 0.1 % TOPICAL CREAM
INTRAMUSCULAR | 0 refills | 0.00000 days | Status: CP
Start: 2021-04-29 — End: ?
  Filled 2021-05-03: qty 30, 30d supply, fill #0

## 2021-05-04 NOTE — Unmapped (Signed)
Contacted daughter via mobile.  Received message from daughter [name] stating that patient plans to travel to [facility] 4/22-6/5. Patient just received Biktarvy shipment but I suggested that daughter attempt to get a second bottle from Shared Services to make sure patient has adequate supply of all her medications. [name] stated she will call the pharmacy on Monday.    I also suggested that patient come to get CoVid booster prior to her travels and daughter stated she would try to get her in this week.

## 2021-05-07 MED FILL — VALACYCLOVIR 1 GRAM TABLET: ORAL | 30 days supply | Qty: 30 | Fill #1

## 2021-05-07 MED FILL — SULFAMETHOXAZOLE 400 MG-TRIMETHOPRIM 80 MG TABLET: ORAL | 30 days supply | Qty: 30 | Fill #1

## 2021-05-07 MED FILL — BIKTARVY 50 MG-200 MG-25 MG TABLET: ORAL | 30 days supply | Qty: 30 | Fill #1

## 2021-05-07 MED FILL — ALENDRONATE 70 MG TABLET: ORAL | 28 days supply | Qty: 4 | Fill #3

## 2021-05-07 NOTE — Unmapped (Signed)
[facility] Clinical Assessment & Refill Coordination Note    Joan Burns, DOB: 1950-09-14  Phone: [phone] (home)     All above HIPAA information was verified with patient's family member, Saralyn Pilar (daughter).     Was a Nurse, learning disability used for this call? No    Specialty Medication(s):   Infectious Disease: Biktarvy     Current Outpatient Medications   Medication Sig Dispense Refill    alendronate (FOSAMAX) 70 MG tablet Take 1 tablet (70 mg total) by mouth every seven (7) days. 4 tablet 11    bictegrav-emtricit-tenofov ala (BIKTARVY) 50-200-25 mg tablet Take 1 tablet by mouth in the morning. 30 tablet 6    calcium carbonate-vitamin D3 600 mg-10 mcg (400 unit) per tablet Take 1 tablet by mouth daily.      levothyroxine (SYNTHROID) 100 MCG tablet Take 1 tablet (100 mcg total) by mouth daily. 90 tablet 2    polyvinyl alcohol/povidone/PF (REFRESH CLASSIC, PF, OPHT) Apply to eye.      sulfamethoxazole-trimethoprim (BACTRIM) 400-80 mg per tablet Take 1 tablet (80 mg of trimethoprim total) by mouth in the morning. 30 tablet 5    triamcinolone (KENALOG) 0.1 % cream Apply to affected area twice a day for 1 month 30 g 0    valACYclovir (VALTREX) 1000 MG tablet Take 1 tablet (1,000 mg total) by mouth daily. 30 tablet 5     No current facility-administered medications for this visit.        Changes to medications: Aleane reports no changes at this time.    Allergies   Allergen Reactions    Tetanus-Diphtheria Toxoids-Td Rash       Changes to allergies: No    SPECIALTY MEDICATION ADHERENCE     Biktarvy 50-200-25 mg: 42 days of medicine on hand       Medication Adherence    Patient reported X missed doses in the last month: 0  Specialty Medication: Biktarvy 50-200-25mg   Patient is on additional specialty medications: No  Any gaps in refill history greater than 2 weeks in the last 3 months: no  Demonstrates understanding of importance of adherence: yes  Informant: child/children  Reliability of informant: reliable  Provider-estimated medication adherence level: good  Patient is at risk for Non-Adherence: No          Specialty medication(s) dose(s) confirmed: Regimen is correct and unchanged.     Are there any concerns with adherence? No    Adherence counseling provided? Not needed    CLINICAL MANAGEMENT AND INTERVENTION      Clinical Benefit Assessment:    Do you feel the medicine is effective or helping your condition? Yes    HIV ASSOCIATED LABS:     Lab Results   Component Value Date/Time    HIVRS Not Detected 04/16/2021 09:22 AM    HIVRS Not Detected 09/15/2020 04:56 PM    HIVRS Detected (A) 07/20/2020 01:20 PM    HIVCP 12,564 (H) 07/20/2020 01:20 PM    ACD4 120 (L) 04/16/2021 09:22 AM    ACD4 102 (L) 09/15/2020 04:56 PM    ACD4 76 (L) 07/20/2020 01:20 PM       Clinical Benefit counseling provided? Labs from 04/16/21 show evidence of clinical benefit    Adverse Effects Assessment:    Are you experiencing any side effects? No    Are you experiencing difficulty administering your medicine? No    Quality of Life Assessment:    Quality of Life    Rheumatology  Oncology  Dermatology  Cystic Fibrosis          How many days over the past month did your HIV  keep you from your normal activities? For example, brushing your teeth or getting up in the morning. 0    Have you discussed this with your provider? Not needed    Acute Infection Status:    Acute infections noted within Epic:  No active infections  Patient reported infection: None    Therapy Appropriateness:    Is therapy appropriate and patient progressing towards therapeutic goals? Yes, therapy is appropriate and should be continued    DISEASE/MEDICATION-SPECIFIC INFORMATION      N/A    PATIENT SPECIFIC NEEDS     Does the patient have any physical, cognitive, or cultural barriers? No    Is the patient high risk? No    Does the patient require a Care Management Plan? No         SHIPPING     Specialty Medication(s) to be Shipped:   Infectious Disease: Biktarvy    Other medication(s) to be shipped:  alendronate 70mg , smz-tmp 400-80mg , valacyclovir 1000mg      Changes to insurance: No    Delivery Scheduled: Yes, Expected medication delivery date: 05/08/21.     Medication will be delivered via UPS to the confirmed temporary address in [facility].    The patient will receive a drug information handout for each medication shipped and additional FDA Medication Guides as required.  Verified that patient has previously received a Conservation officer, historic buildings and a Surveyor, mining.    The patient or caregiver noted above participated in the development of this care plan and knows that they can request review of or adjustments to the care plan at any time.      All of the patient's questions and concerns have been addressed.    Roderic Palau   [facility] Specialty Pharmacist

## 2021-05-08 DIAGNOSIS — L290 Pruritus ani: Principal | ICD-10-CM

## 2021-05-08 DIAGNOSIS — A6000 Herpesviral infection of urogenital system, unspecified: Principal | ICD-10-CM

## 2021-05-08 MED ORDER — TRIAMCINOLONE ACETONIDE 0.1 % TOPICAL CREAM
INTRAMUSCULAR | 1 refills | 0.00000 days | Status: CP
Start: 2021-05-08 — End: 2022-05-08

## 2021-07-04 NOTE — Unmapped (Signed)
The [facility] has made a second and final attempt to reach this patient to refill the following medication: Biktarvy.      We have left voicemails on the following phone numbers: [phone] and [phone] and have sent a MyChart message.    Dates contacted: 6/12, 6/14  Last scheduled delivery: shipped 4/17    The patient may be at risk of non-compliance with this medication. The patient should call the [facility] at [phone]  Option 4, then Option 2 (all other specialty patients) to refill medication.    Westley Gambles   [facility] Specialty Technician

## 2021-08-13 ENCOUNTER — Ambulatory Visit: Admit: 2021-08-13 | Payer: BLUE CROSS/BLUE SHIELD

## 2021-09-25 ENCOUNTER — Emergency Department (HOSPITAL_COMMUNITY): Payer: Medicaid Other

## 2021-09-25 ENCOUNTER — Emergency Department (HOSPITAL_COMMUNITY)
Admission: EM | Admit: 2021-09-25 | Discharge: 2021-09-25 | Disposition: A | Discharge status: Home / Self Care | Payer: Medicaid Other | Attending: Student | Admitting: Student

## 2021-09-25 ENCOUNTER — Encounter (HOSPITAL_COMMUNITY): Payer: Self-pay

## 2021-09-25 DIAGNOSIS — U071 COVID-19: Secondary | ICD-10-CM | POA: Insufficient documentation

## 2021-09-25 DIAGNOSIS — M25561 Pain in right knee: Secondary | ICD-10-CM | POA: Insufficient documentation

## 2021-09-25 DIAGNOSIS — R109 Unspecified abdominal pain: Secondary | ICD-10-CM | POA: Diagnosis not present

## 2021-09-25 DIAGNOSIS — J208 Acute bronchitis due to other specified organisms: Secondary | ICD-10-CM | POA: Insufficient documentation

## 2021-09-25 DIAGNOSIS — R059 Cough, unspecified: Secondary | ICD-10-CM | POA: Diagnosis present

## 2021-09-25 LAB — RESP PANEL BY RT-PCR (FLU A&B, COVID) ARPGX2
Influenza A by PCR: NEGATIVE
Influenza B by PCR: NEGATIVE
SARS Coronavirus 2 by RT PCR: POSITIVE — AB

## 2021-09-25 MED ORDER — BENZONATATE 100 MG PO CAPS: 100.0000 mg | ORAL_CAPSULE | Freq: Three times a day (TID) | ORAL | 0 refills | Status: AC

## 2021-09-25 MED ORDER — MOLNUPIRAVIR 200 MG PO CAPS: 4.0000 | ORAL_CAPSULE | Freq: Two times a day (BID) | ORAL | 0 refills | Status: AC

## 2021-09-25 MED ORDER — CETIRIZINE HCL 10 MG PO TABS: 10.0000 mg | ORAL_TABLET | Freq: Every day | ORAL | 0 refills | Status: AC

## 2021-09-25 MED ORDER — ACETAMINOPHEN 325 MG PO TABS
650.0000 mg | ORAL_TABLET | Freq: Once | ORAL | Status: AC
  Administered 2021-09-25: 650 mg via ORAL
  Filled 2021-09-25: qty 2

## 2021-09-25 NOTE — Discharge Instructions (Addendum)
You have been provided a prescription for an antiviral medication by the name of molnupiravir.  Please take this to the pharmacy so you can get this filled.  You will then take 4 capsules every 12 hours for the next 5 days.  2 other prescriptions have been provided for you.  The first is Zyrtec, a decongestant.  Take 1 tablet daily to help reduce congestion.  The second is Tessalon, a cough suppressant.  Take 1 tablet every 8 hours to help reduce your cough.  Continue with conservative treatment measures as well such as decongestants, Tylenol, rest, hydration, and keeping up with nutritional intake.  Your knee x-rays showed no evidence of fracture, did confirm some mild swelling.  This is likely related to recent overuse.  I have provided you the contact information for local orthopedic office.  Please call to schedule an appointment with Dr. Ophelia Charter within the next week or so for reevaluation of your right knee and continued medical management.  Return to the ED for new or worsening symptoms as discussed.

## 2021-09-25 NOTE — ED Notes (Signed)
Ortho tech called 

## 2021-09-25 NOTE — ED Provider Notes (Cosign Needed Addendum)
[facility]-EMERGENCY DEPT Provider Note   CSN: [id number] Arrival date & time: 09/25/21  1417     History  Chief Complaint  Patient presents with   Cough   Abdominal Pain    Brenda Shah is a 71 y.o. female presenting today with congestion, sinus pressure, nonproductive cough, and some chills.  Symptom onset on Friday night, has slowly worsened since then.  Has not tried anything to help her symptoms.  Unsure of what makes them worse.  Denies fever, N/V/D, abdominal pain, back pain, urinary symptoms, chest pain, or shortness of breath.  No known recent sick contacts.  Also complaining of some occasional right knee pain over the last 1 to 2 weeks.  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  1 day last week she came from work and noticed her right knee was tender and little bit swollen.  Notes to have some mild pain with range of motion and walking.  Denies redness, warmth, or fevers.  Notes that the swelling fluctuates with activities.  No Hx of DVT/VTE, gout, or recent surgeries.  No hemoptysis or IVDU.  No other joint pains.  Hx of HIV, hypothyroidism, HSV-2.  Translator utilized for duration of encounter.  The history is provided by the patient and medical records. The history is limited by a language barrier. A language interpreter was used.  Cough Abdominal Pain Associated symptoms: cough      Home Medications Prior to Admission medications   Medication Sig Start Date End Date Taking? Authorizing Provider  benzonatate (TESSALON) 100 MG capsule Take 1 capsule (100 mg total) by mouth every 8 (eight) hours. 09/25/21  Yes Cecil Cobbs, PA-C  cetirizine (ZYRTEC ALLERGY) 10 MG tablet Take 1 tablet (10 mg total) by mouth daily. 09/25/21  Yes Cecil Cobbs, PA-C  molnupiravir EUA (LAGEVRIO) 200 MG CAPS capsule Take 4 capsules (800 mg total) by mouth 2 (two) times daily for 5 days. 09/25/21 09/30/21 Yes Cecil Cobbs, PA-C       Allergies    Patient has no allergy information on record.    Review of Systems   Review of Systems  Respiratory:  Positive for cough.   Gastrointestinal:  Positive for abdominal pain.    Physical Exam Updated Vital Signs BP (!) 141/74   Pulse 84   Temp 98.3 F (36.8 C) (Oral)   Resp 18   SpO2 95%  Physical Exam Vitals and nursing note reviewed.  Constitutional:      General: She is not in acute distress.    Appearance: She is well-developed. She is not ill-appearing or toxic-appearing.  HENT:     Head: Normocephalic and atraumatic.     Right Ear: Tympanic membrane, ear canal and external ear normal.     Left Ear: Tympanic membrane, ear canal and external ear normal.     Nose: Congestion and rhinorrhea present. Rhinorrhea is clear.     Right Turbinates: Swollen.     Left Turbinates: Swollen.     Mouth/Throat:     Mouth: Mucous membranes are moist. No angioedema.     Tongue: No lesions. Tongue does not deviate from midline.     Palate: No mass and lesions.     Pharynx: Oropharynx is clear. Uvula midline. Posterior oropharyngeal erythema (Mild) present. No pharyngeal swelling, oropharyngeal exudate or uvula swelling.     Tonsils: No tonsillar exudate or tonsillar abscesses.  Eyes:     Conjunctiva/sclera: Conjunctivae  normal.  Cardiovascular:     Rate and Rhythm: Normal rate and regular rhythm.     Heart sounds: Normal heart sounds. No murmur heard. Pulmonary:     Effort: Pulmonary effort is normal. No respiratory distress.     Breath sounds: Normal breath sounds. No wheezing.     Comments: CTAB, able to communicate without difficulty, without increased respiratory effort Chest:     Chest wall: Tenderness (Mild, chest wall) present.  Abdominal:     Palpations: Abdomen is soft.     Tenderness: There is no abdominal tenderness.  Musculoskeletal:        General: No swelling.     Cervical back: Neck supple.     Comments: Right knee with mild suprapatellar tenderness  and minimal swelling.  Without warmth, significant swelling, inhibited ROM, notably painful ROM, erythema, varicose veins, rash, ecchymosis, or wounds.  No obvious bony deformity, mal-rotation, or bony tenderness.  ROM appears grossly intact.  Mildly antalgic gait at times, otherwise ambulates well without assistance.  Skin:    General: Skin is warm and dry.     Capillary Refill: Capillary refill takes less than 2 seconds.  Neurological:     Mental Status: She is alert and oriented to person, place, and time.  Psychiatric:        Mood and Affect: Mood normal.     ED Results / Procedures / Treatments   Labs (all labs ordered are listed, but only abnormal results are displayed) Labs Reviewed  RESP PANEL BY RT-PCR (FLU A&B, COVID) ARPGX2 - Abnormal; Notable for the following components:      Result Value   SARS Coronavirus 2 by RT PCR POSITIVE (*)    All other components within normal limits    EKG None  Radiology CLINICAL DATA:  Cough.  Chest tightness and rib pain.    EXAM:  CHEST - 2 VIEW    COMPARISON:  None Available.    FINDINGS:  The cardiomediastinal contours are normal. Mild bronchial  thickening. Pulmonary vasculature is normal. No consolidation,  pleural effusion, or pneumothorax. No acute osseous abnormalities  are seen.    IMPRESSION:  Mild bronchial thickening may represent bronchitis in the setting of  cough.      Electronically Signed    By: Narda Rutherford M.D.    On: 09/25/2021 15:50    CLINICAL DATA:  Right knee swelling.    EXAM:  RIGHT KNEE - COMPLETE 4+ VIEW    COMPARISON:  None Available.    FINDINGS:  Normal alignment. Slight medial tibiofemoral joint space narrowing  and peripheral spurring. There is a small knee joint effusion. No  fracture. No erosion, bony destruction or focal bone abnormality.  Mild soft tissue edema anteriorly.    IMPRESSION:  1. 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 No acute osseous abnormality.  2. Mild anterior soft tissue edema.      Electronically Signed    By: Narda Rutherford M.D.    On: 09/25/2021 20:24    Procedures Procedures    Medications Ordered in ED Medications - No data to display  ED Course/ Medical Decision Making/ A&P                           Medical Decision Making Amount and/or Complexity of Data Reviewed Radiology: ordered.  Risk OTC drugs. Prescription drug management.   71 y.o. female presents to the ED for  concern of Cough and Abdominal Pain   This involves an extensive number of treatment options, and is a complaint that carries with it a high risk of complications and morbidity.  The emergent differential diagnosis prior to evaluation includes, but is not limited to: Pneumonia, bronchitis, pneumothorax, pleural effusion  This is not an exhaustive differential.   Past Medical History / Co-morbidities / Social History: Hx of HIV, hypothyroidism, HSV-2 Social Determinants of Health include: Language barrier, translation service utilized  Additional History:  Obtained by chart review.  Notably hx of HIV  Lab Tests: I ordered, and personally interpreted labs.  The pertinent results include:   Covid POSITIVE  Imaging Studies: I ordered imaging studies including CXR.   I independently visualized and interpreted imaging which showed bronchitis without evidence of pneumonia, effusion, cardiomegaly, or pneumothorax I agree with the radiologist interpretation.  ED Course: Pt well-appearing on exam.  Presenting today with flu-like symptoms.  These include sinus pressure, nonproductive cough, and chills.  Pt is nontoxic, nonseptic appearing in NAD.  Afebrile.  Breathing without difficulty, CTAB.  Mild occasional cough appreciated.  Low suspicion for pulmonary embolism.  Patient satting at least 95-96% on room air, does not appear to be in respiratory distress, without active shortness of breath, not  tachycardic.  Chest wall pain appreciated, and occurs when pt coughs or when palpated, otherwise absent.  No IVDU.  No recent cancer.  Clinical presentation and history provides low suspicion for RPA, PTA, strep pharyngitis, or acute esophagitis.  Neck very supple on exam, no meningeal signs.  Low suspicion of meningitis at this time.  Covid-19 positive today.  Pt CXR indicative of bronchitis, however negative for other acute cardiopulmonary pathology.  Appears uncomplicated at this time.  Symptom onset still within 5 days, pt is candidate for antiviral therapy.  Molnupiravir sent to pharmacy.  Also recommend symptomatic treatment.  Tessalon and zyrtec also sent to pharmacy.  Strict return precautions discussed. Also with mild right knee tenderness.  Pt has been standing on her feet for long periods at work.  Symptom onset after a long day at work within the last 1-2 weeks.  Again afebrile, no other joints affected, and exam findings non-concerning.  Minimal swelling and tenderness appreciated.  Swelling fluctuates with work load and activity level.  X-ray indicated osteoarthritis with small joint effusion.  No erythema, warmth, significant swelling, or reduced ROM appreciated on exam.  Clinical picture provides low suspicion for septic arthritis, gout, DVT, or cellulitis.  Pain managed in ED.  Recommend close follow up with Orthopedics for further evaluation and management.  Knee sleeve provided for support.  Strict return precautions discussed.  Resources provided.  Patient in NAD and in good condition at time of discharge.  Disposition: After consideration the patient's encounter today, I do not feel today's workup suggests an emergent condition requiring admission or immediate intervention beyond what has been performed at this time.  Safe for discharge; instructed to return immediately for worsening symptoms, change in symptoms or any other concerns.  I have reviewed the patients home medicines and have  made adjustments as needed.  Discussed course of treatment with the patient, whom demonstrated understanding.  Patient in agreement and has no further questions.    I discussed this case with my attending physician Dr. Posey Rea, who agreed with the proposed treatment course and cosigned this note including patient's presenting symptoms, physical exam, and planned diagnostics and interventions.  Attending physician stated agreement with plan or made changes to plan  which were implemented.     This chart was dictated using voice recognition software.  Despite best efforts to proofread, errors can occur which can change the documentation meaning.         Final Clinical Impression(s) / ED Diagnoses Final diagnoses:  Acute COVID-19  Acute bronchitis due to other specified organisms    Rx / DC Orders ED Discharge Orders          Ordered    molnupiravir EUA (LAGEVRIO) 200 MG CAPS capsule  2 times daily        09/25/21 1935    benzonatate (TESSALON) 100 MG capsule  Every 8 hours        09/25/21 1935    cetirizine (ZYRTEC ALLERGY) 10 MG tablet  Daily        09/25/21 1935              Cecil Cobbs, PA-C 09/29/21 0706    Cecil Cobbs, PA-C 09/29/21 6761    Glendora Score, MD 10/02/21 [phone]

## 2021-09-25 NOTE — ED Provider Triage Note (Signed)
Emergency Medicine Provider Triage Evaluation Note  Brenda Shah , a 71 y.o. female  was evaluated in triage.  Pt complains of congestion, cough, sinus headache, and overall feeling unwell since Friday night.  Denies fevers or chills.  Denies sore throat.  Translator service utilized.  Review of Systems  Positive:  Negative: See above  Physical Exam  BP (!) 154/91   Pulse 88   Temp (!) 97.5 F (36.4 C) (Oral)   Resp 18   SpO2 96%  Gen:   Awake, no distress   Resp:  Normal effort, mild wheeze MSK:   Moves extremities without difficulty  Other:  Abdomen soft, nontender  Medical Decision Making  Medically screening exam initiated at 2:52 PM.  Appropriate orders placed.  Brenda Shah was informed that the remainder of the evaluation will be completed by another provider, this initial triage assessment does not replace that evaluation, and the importance of remaining in the ED until their evaluation is complete.     Cecil Cobbs, PA-C 09/25/21 1454

## 2021-09-25 NOTE — ED Triage Notes (Signed)
Pt presents with c/o cough, abdominal pain, nausea since Friday. Family at bedside report that she does not feel well and is "suffering".

## 2021-09-30 NOTE — Unmapped (Signed)
Eiliana Mcdaniel  Oct 10, 1950    Assessment & Plan:  Hypothyroidism, unspecified type  -     TSH; Future  -     T4, Free; Future    HIV infection, unspecified symptom status (CMS-HCC)  -     CBC w/ Differential; Future  -     HIV RNA, Quantitative, PCR; Future  -     Lymphocyte Markers Limited; Future    Anemia, unspecified type  -     CBC w/ Differential; Future    Screening for endocrine, metabolic and immunity disorder  -     Comprehensive Metabolic Panel; Future    COVID    Bilateral hip pain  -     XR Lumbar Spine 2 or 3 Views; Future  -     XR Femur 2 Views Bilateral; Future  -     XR Pelvis 3 Or More Views; Future    Back pain, unspecified back location, unspecified back pain laterality, unspecified chronicity  -     XR Lumbar Spine 2 or 3 Views; Future  -     XR Femur 2 Views Bilateral; Future  -     XR Pelvis 3 Or More Views; Future    Bilateral knee effusions  -     Cancel: US Extremity Nonvasc Comp (Joint,Muscle,Tendon,Ligament) Bilat; Future  -     [place] Knee Bilateral; Future      Patient presented today for multiple concerns. She is due for a lot of screening/preventative health measures and labs. Interpreter services were used today during visit. She reported that she is not taking any medication at this time besides what she was given in the ED. Her COVID infection appears to have improved with the therapies provided.     Her biggest concern today was regarding her hip as well as her knee pain. She was not able to provide much history regarding her pain. XR of her knees revealed: Mild OA with small joint effusion and mild anterior soft tissue edema. She would like further imaging performed regarding these areas.     Xrays of low back and hip/pelvic region were ordered as well as ultrasound of her knees to assess for possible ligament damage, baker cyst, or additional causes to her knee effusion and pain.  Multiple labs were obtained today including: CBC, CMP, TSH, Free T4. She has not been taking medication for her hypothyroidism and unsure if she is taking her ART and therefore CD4 count and Viral Load were also ordered.     Her blood pressure was elevated today which could have been secondary to pain. Her BP previously noted to be WNL      PLAN  -To return in 2 weeks and we will discuss her labs in person as well as review imaging if performed.   -Pending pain and ultrasound of knee, possible knee injection for OA in knees as well.   -Will review medications in depth again to assess if on ART and taking Fosamax   -Monitor BP closely.   -Will Encourage Mammogram and CRC screening.       Subjective:  Subjective    HPI: Tomeeka Pond is a 71 y.o. female here for multiple concerns.   Patient speaks [place]. Use of interpreter services was performed:   Interpreter name: Eston Mould number: [phone]     #Recent ED, feeling poorly, COVID positive 09/25/21  However, she is concerned for:   -She went to the ED for rib pain,  knee pain and hip pain as well as cough, sinus congestion  -She was diagnosed with COVID and Bronchitis.   -She was given Benzonate, molnupiravir and Zyretec and reports taking.   -Her biggest concern for today is that she is now having left knee pain. She previously had right knee pain but endorses that her left knee is bothering her.   -She denies any recent falls and denies recent travel but does stand a lot at work (works at a Audiological [employer]).   -She is now having left knee pain   -She denies chest pain, Dyspnea but endorses occasional lightheadedness, dizziness     ED visit summary  Cloyce Naairah Crowe , a 71 y.o. female was evaluated in triage. Pt complains of congestion, cough, sinus headache, and overall feeling unwell since Friday night. Denies fevers or chills. Denies sore throat.    Translator service utilized.    Review of Systems  Positive:  Negative: See above    Physical Exam  BP (!) 154/91 - Pulse 88 - Temp (!) 97.5 ??F (36.4 ??C) (Oral) - Resp 18 - SpO2 96%  Gen: Awake, no distress  Resp: Normal effort, mild wheeze  MSK: Moves extremities without difficulty  Other: Abdomen soft, nontender    Medical Decision Making  Medically screening exam initiated at 2:52 PM. Appropriate orders placed. Mandy Monsserrat Kinzler was informed that the remainder of the evaluation will be completed by another provider, this initial triage assessment does not replace that evaluation, and the importance of remaining in the ED until their evaluation is complete.    COVID positive, Flu negative  Knee injection: . 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 No acute osseous abnormality.  2. Mild anterior soft tissue edema.     IMPRESSION:  Mild bronchial thickening may represent bronchitis in the setting of  cough.        BP Readings from Last 3 Encounters:   10/02/21 166/78   04/16/21 133/64   03/28/21 132/60        #preventative health  -Colon Cancer screening is due   -Mammogram is due     I have reviewed past medical, surgical, medication, allergy, social and family histories today.        ROS:  Review of systems negative unless otherwise noted as per HPI.    Objective:  Objective    Vitals:    10/02/21 0846   BP: 166/78   Pulse:    Temp:    SpO2:      Body mass index is 30.8 kg/m??.    BP Readings from Last 3 Encounters:   10/02/21 166/78   04/16/21 133/64   03/28/21 132/60         Physical Exam:  Physical Exam  Constitutional:       General: She is not in acute distress.     Appearance: Normal appearance. She is not ill-appearing or toxic-appearing.   HENT:      Head: Normocephalic and atraumatic.      Nose: No congestion or rhinorrhea.      Mouth/Throat:      Mouth: Mucous membranes are moist.   Eyes:      General: No scleral icterus.        Right eye: No discharge.         Left eye: No discharge.      Extraocular Movements: Extraocular movements intact.      Pupils: Pupils are equal, round, and reactive  to light.   Cardiovascular:      Rate and Rhythm: Normal rate and regular rhythm.   Pulmonary:      Effort: Pulmonary effort is normal.      Comments: Decreased breath sounds but no cough/wheeze/rhonchi on exam  Abdominal:      Palpations: Abdomen is soft.   Musculoskeletal:         General: Tenderness (reported tenderness to palpation to anterior knee and popliteal fossa bilaterally) present.      Right knee: Effusion present. No erythema, ecchymosis or crepitus. Decreased range of motion. No LCL laxity or MCL laxity.      Instability Tests: Anterior drawer test negative.      Left knee: Effusion present. No erythema, ecchymosis or crepitus. Decreased range of motion. No LCL laxity or MCL laxity.     Instability Tests: Anterior drawer test negative.      Right lower leg: No swelling.      Left lower leg: No swelling.      Comments: Homan sign negative bilaterally. Trace edema below knees   Lymphadenopathy:      Cervical: No cervical adenopathy.   Skin:     General: Skin is warm.   Neurological:      General: No focal deficit present.      Mental Status: She is alert and oriented to person, place, and time.      Gait: Gait (gait appears stable) normal.   Psychiatric:         Mood and Affect: Mood normal.

## 2021-10-02 ENCOUNTER — Ambulatory Visit
Admit: 2021-10-02 | Discharge: 2021-10-03 | Payer: BLUE CROSS/BLUE SHIELD | Attending: Student in an Organized Health Care Education/Training Program | Primary: Student in an Organized Health Care Education/Training Program

## 2021-10-02 DIAGNOSIS — D649 Anemia, unspecified: Principal | ICD-10-CM

## 2021-10-02 DIAGNOSIS — M549 Dorsalgia, unspecified: Principal | ICD-10-CM

## 2021-10-02 DIAGNOSIS — M25552 Pain in left hip: Principal | ICD-10-CM

## 2021-10-02 DIAGNOSIS — Z130 Encounter for screening for diseases of the blood and blood-forming organs and certain disorders involving the immune mechanism: Principal | ICD-10-CM

## 2021-10-02 DIAGNOSIS — Z1329 Encounter for screening for other suspected endocrine disorder: Principal | ICD-10-CM

## 2021-10-02 DIAGNOSIS — U071 COVID: Principal | ICD-10-CM

## 2021-10-02 DIAGNOSIS — M25551 Pain in right hip: Principal | ICD-10-CM

## 2021-10-02 DIAGNOSIS — Z13228 Encounter for screening for other metabolic disorders: Principal | ICD-10-CM

## 2021-10-02 DIAGNOSIS — E039 Hypothyroidism, unspecified: Principal | ICD-10-CM

## 2021-10-02 DIAGNOSIS — B20 Human immunodeficiency virus [HIV] disease: Principal | ICD-10-CM

## 2021-10-02 LAB — CBC W/ AUTO DIFF
BASOPHILS ABSOLUTE COUNT: 0 10*9/L (ref 0.0–0.1)
BASOPHILS RELATIVE PERCENT: 0.6 %
EOSINOPHILS ABSOLUTE COUNT: 0.3 10*9/L (ref 0.0–0.5)
EOSINOPHILS RELATIVE PERCENT: 5.9 %
HEMATOCRIT: 32.8 % — ABNORMAL LOW (ref 34.0–44.0)
HEMOGLOBIN: 10.6 g/dL — ABNORMAL LOW (ref 11.3–14.9)
LYMPHOCYTES ABSOLUTE COUNT: 2.1 10*9/L (ref 1.1–3.6)
LYMPHOCYTES RELATIVE PERCENT: 35.9 %
MEAN CORPUSCULAR HEMOGLOBIN CONC: 32.4 g/dL (ref 32.0–36.0)
MEAN CORPUSCULAR HEMOGLOBIN: 23.6 pg — ABNORMAL LOW (ref 25.9–32.4)
MEAN CORPUSCULAR VOLUME: 72.8 fL — ABNORMAL LOW (ref 77.6–95.7)
MEAN PLATELET VOLUME: 10.7 fL (ref 6.8–10.7)
MONOCYTES ABSOLUTE COUNT: 0.5 10*9/L (ref 0.3–0.8)
MONOCYTES RELATIVE PERCENT: 7.9 %
NEUTROPHILS ABSOLUTE COUNT: 2.9 10*9/L (ref 1.8–7.8)
NEUTROPHILS RELATIVE PERCENT: 49.7 %
PLATELET COUNT: 260 10*9/L (ref 150–450)
RED BLOOD CELL COUNT: 4.5 10*12/L (ref 3.95–5.13)
RED CELL DISTRIBUTION WIDTH: 17.9 % — ABNORMAL HIGH (ref 12.2–15.2)
WBC ADJUSTED: 5.8 10*9/L (ref 3.6–11.2)

## 2021-10-02 LAB — HIV RNA, QUANTITATIVE, PCR
HIV RNA LOG10: 1.74 {Log_copies}/mL — ABNORMAL HIGH (ref <0.00)
HIV RNA QNT RSLT: DETECTED — AB
HIV RNA: 55 {copies}/mL — ABNORMAL HIGH (ref <0)

## 2021-10-02 LAB — COMPREHENSIVE METABOLIC PANEL
ALBUMIN: 3.8 g/dL (ref 3.4–5.0)
ALKALINE PHOSPHATASE: 109 U/L (ref 46–116)
ALT (SGPT): 18 U/L (ref 10–49)
ANION GAP: 7 mmol/L (ref 5–14)
AST (SGOT): 22 U/L (ref <=34)
BILIRUBIN TOTAL: 0.4 mg/dL (ref 0.3–1.2)
BLOOD UREA NITROGEN: 10 mg/dL (ref 9–23)
BUN / CREAT RATIO: 15
CALCIUM: 9.3 mg/dL (ref 8.7–10.4)
CHLORIDE: 102 mmol/L (ref 98–107)
CO2: 29 mmol/L (ref 20.0–31.0)
CREATININE: 0.66 mg/dL
EGFR CKD-EPI (2021) FEMALE: >90 mL/min/{1.73_m2} (ref >=60)
GLUCOSE RANDOM: 100 mg/dL (ref 70–179)
POTASSIUM: 3.9 mmol/L (ref 3.4–4.8)
PROTEIN TOTAL: 9.6 g/dL — ABNORMAL HIGH (ref 5.7–8.2)
SODIUM: 138 mmol/L (ref 135–145)

## 2021-10-02 LAB — LYMPH MARKER LIMITED,FLOW
ABSOLUTE CD3 CNT: 1827 {cells}/uL (ref 915–3400)
ABSOLUTE CD4 CNT: 168 {cells}/uL — ABNORMAL LOW (ref 510–2320)
ABSOLUTE CD8 CNT: 1617 {cells}/uL — ABNORMAL HIGH (ref 180–1520)
CD3% (T CELLS): 87 % — ABNORMAL HIGH (ref 61–86)
CD4% (T HELPER): 8 % — ABNORMAL LOW (ref 34–58)
CD4:CD8 RATIO: 0.1 — ABNORMAL LOW (ref 0.9–4.8)
CD8% T SUPPRESR: 77 % — ABNORMAL HIGH (ref 12–38)

## 2021-10-02 LAB — SLIDE REVIEW

## 2021-10-02 LAB — TSH: THYROID STIMULATING HORMONE: 7.591 u[IU]/mL — ABNORMAL HIGH (ref 0.550–4.780)

## 2021-10-02 LAB — T4, FREE: FREE T4: 1.15 ng/dL (ref 0.89–1.76)

## 2021-10-08 NOTE — Unmapped (Signed)
Attempted to contact patient by phone. Left discrete message for patient to call clinic at [phone].  I am calling to discuss:  Received message from [facility] that patient has not been able to be reached since April 2023.   Used [place] interpreter to leave message to call clinic.  Also left message for patient's daughter to call the clinic.

## 2021-10-09 NOTE — Unmapped (Signed)
[facility] Clinical Assessment & Refill Coordination Note    Joan Burns, DOB: 1950/03/06  Phone: [phone] (home)     All above HIPAA information was verified with patient's family member, Saralyn Pilar (daughter).     Was a Nurse, learning disability used for this call? No    Specialty Medication(s):   Infectious Disease: Biktarvy     Current Outpatient Medications   Medication Sig Dispense Refill    alendronate (FOSAMAX) 70 MG tablet Take 1 tablet (70 mg total) by mouth every seven (7) days. 4 tablet 11    benzonatate (TESSALON) 100 MG capsule TAKE 1 CAPSULE BY MOUTH EVERY 8 HOURS      bictegrav-emtricit-tenofov ala (BIKTARVY) 50-200-25 mg tablet Take 1 tablet by mouth in the morning. 30 tablet 6    calcium carbonate-vitamin D3 600 mg-10 mcg (400 unit) per tablet Take 1 tablet by mouth daily.      cetirizine (ZYRTEC) 10 MG tablet Take 1 tablet (10 mg total) by mouth daily.      LAGEVRIO, EUA, 200 mg capsule TAKE 4 CAPSULES BY MOUTH TWICE A DAY X 5 DAYS      levothyroxine (SYNTHROID) 100 MCG tablet Take 1 tablet (100 mcg total) by mouth daily. 90 tablet 2    polyvinyl alcohol/povidone/PF (REFRESH CLASSIC, PF, OPHT) Apply to eye.      sulfamethoxazole-trimethoprim (BACTRIM) 400-80 mg per tablet Take 1 tablet (80 mg of trimethoprim total) by mouth in the morning. 30 tablet 5    triamcinolone (KENALOG) 0.1 % cream Apply to affected area twice a day for 1 month 30 g 1    valACYclovir (VALTREX) 1000 MG tablet Take 1 tablet (1,000 mg total) by mouth daily. 30 tablet 5     No current facility-administered medications for this visit.        Changes to medications:  no changes    Allergies   Allergen Reactions    Tetanus-Diphtheria Toxoids-Td Rash       Changes to allergies: No    SPECIALTY MEDICATION ADHERENCE     Biktarvy 50-200-25 mg: approximately 60 days of medicine on hand       Medication Adherence    Patient reported X missed doses in the last month: 0  Specialty Medication: Biktarvy 50-200-mg: Gap in fill due to [place]  Patient is on additional specialty medications: No  Any gaps in refill history greater than 2 weeks in the last 3 months: yes  Demonstrates understanding of importance of adherence: yes  Informant: child/children  Reliability of informant: reliable  Provider-estimated medication adherence level: good  Patient is at risk for Non-Adherence: No     Specialty medication(s) dose(s) confirmed: Regimen is correct and unchanged.     Are there any concerns with adherence? No    Adherence counseling provided? Not needed    CLINICAL MANAGEMENT AND INTERVENTION      Clinical Benefit Assessment:    Do you feel the medicine is effective or helping your condition? Yes    HIV ASSOCIATED LABS:     Lab Results   Component Value Date/Time    HIVRS Detected (A) 10/02/2021 09:13 AM    HIVRS Not Detected 04/16/2021 09:22 AM    HIVRS Not Detected 09/15/2020 04:56 PM    HIVCP 55 (H) 10/02/2021 09:13 AM    HIVCP 12,564 (H) 07/20/2020 01:20 PM    ACD4 168 (L) 10/02/2021 09:13 AM    ACD4 120 (L) 04/16/2021 09:22 AM    ACD4  102 (L) 09/15/2020 04:56 PM       Clinical Benefit counseling provided? Not needed    Adverse Effects Assessment:    Are you experiencing any side effects? No    Are you experiencing difficulty administering your medicine? No    Quality of Life Assessment:    How many days over the past month did your HIV  keep you from your normal activities? For example, brushing your teeth or getting up in the morning. 0    Have you discussed this with your provider? Not needed    Acute Infection Status:    Acute infections noted within Epic:  No active infections  Patient reported infection: None    Therapy Appropriateness:    Is therapy appropriate and patient progressing towards therapeutic goals? Yes, therapy is appropriate and should be continued    DISEASE/MEDICATION-SPECIFIC INFORMATION      N/A    PATIENT SPECIFIC NEEDS     Does the patient have any physical, cognitive, or cultural barriers? No    Is the patient high risk? No    Does the patient require a Care Management Plan? No     SOCIAL DETERMINANTS OF HEALTH     At the [facility], we have learned that life circumstances - like trouble affording food, housing, utilities, or transportation can affect the health of many of our patients.   That is why we wanted to ask: are you currently experiencing any life circumstances that are negatively impacting your health and/or quality of life? Patient declined to answer    Social Determinants of Health     Financial Resource Strain: High Risk (03/17/2021)    Overall Financial Resource Strain (CARDIA)     Difficulty of Paying Living Expenses: Hard   Internet Connectivity: Not on file   Food Insecurity: Food Insecurity Present (03/17/2021)    Hunger Vital Sign     Worried About Running Out of Food in the Last Year: Sometimes true     Ran Out of Food in the Last Year: Sometimes true   Tobacco Use: Low Risk  (10/02/2021)    Patient History     Smoking Tobacco Use: Never     Smokeless Tobacco Use: Never     Passive Exposure: Not on file   Housing/Utilities: High Risk (03/17/2021)    Housing/Utilities     Within the past 12 months, have you ever stayed: outside, in a car, in a tent, in an overnight shelter, or temporarily in someone else's home (i.e. couch-surfing)?: Yes     Are you worried about losing your housing?: Yes     Within the past 12 months, have you been unable to get utilities (heat, electricity) when it was really needed?: Yes   Alcohol Use: Heavy Drinker (04/14/2020)    Alcohol Use     How often do you have a drink containing alcohol?: 4+ times per week     How many drinks containing alcohol do you have on a typical day when you are drinking?: 10+     How often do you have 5 or more drinks on one occasion?: Daily or almost daily   Transportation Needs: Unmet Transportation Needs (03/17/2021)    PRAPARE - Therapist, art (Medical): Yes     Lack of Transportation (Non-Medical): Yes   Substance Use: Not on file   Health Literacy: Not on file   Physical Activity: Not on file   Interpersonal Safety: Not on file  Stress: Not on file   Intimate Partner Violence: Not on file   Depression: Not at risk (03/28/2021)    PHQ-2     PHQ-2 Score: 0   Social Connections: Not on file       Would you be willing to receive help with any of the needs that you have identified today? Not applicable       SHIPPING     Specialty Medication(s) to be Shipped:   Infectious Disease: She does not need a refill at this time. I will reschedule the refill call to be approximately 40 days from now    Other medication(s) to be shipped: No additional medications requested for fill at this time     Changes to insurance: No    Delivery Scheduled:  No        The patient will receive a drug information handout for each medication shipped and additional FDA Medication Guides as required.  Verified that patient has previously received a Conservation officer, historic buildings and a Surveyor, mining.    The patient or caregiver noted above participated in the development of this care plan and knows that they can request review of or adjustments to the care plan at any time.      All of the patient's questions and concerns have been addressed.    Roderic Palau   [facility] Specialty Pharmacist

## 2021-10-18 ENCOUNTER — Ambulatory Visit
Admit: 2021-10-18 | Discharge: 2021-10-19 | Payer: BLUE CROSS/BLUE SHIELD | Attending: Student in an Organized Health Care Education/Training Program | Primary: Student in an Organized Health Care Education/Training Program

## 2021-10-18 DIAGNOSIS — U071 COVID: Principal | ICD-10-CM

## 2021-10-18 DIAGNOSIS — R899 Unspecified abnormal finding in specimens from other organs, systems and tissues: Principal | ICD-10-CM

## 2021-10-18 DIAGNOSIS — E039 Hypothyroidism, unspecified: Principal | ICD-10-CM

## 2021-10-18 DIAGNOSIS — M25561 Pain in right knee: Principal | ICD-10-CM

## 2021-10-18 DIAGNOSIS — B20 Human immunodeficiency virus [HIV] disease: Principal | ICD-10-CM

## 2021-10-18 MED ORDER — LEVOTHYROXINE 100 MCG TABLET
ORAL_TABLET | Freq: Every day | ORAL | 0 refills | 90.00000 days | Status: CP
Start: 2021-10-18 — End: 2022-01-16
  Filled 2021-10-31: qty 90, 90d supply, fill #0

## 2021-10-18 MED ORDER — BIKTARVY 50 MG-200 MG-25 MG TABLET
ORAL_TABLET | Freq: Every day | ORAL | 6 refills | 30.00000 days | Status: CP
Start: 2021-10-18 — End: ?

## 2021-10-18 NOTE — Unmapped (Signed)
Joan Burns  July 17, 1950    Assessment & Plan:  HIV disease (CMS-HCC)  -     Biktarvy; Take 1 tablet by mouth in the morning.    Hypothyroidism, unspecified type    COVID    Acute pain of right knee  -     PVL Venous Duplex Lower Extremity Right; Future    Abnormal laboratory test    Other orders  -     levothyroxine; Take 1 tablet (100 mcg total) by mouth daily.  -     benzonatate; prendre un comprim?? toutes les 8 heures au besoin pour la toux  -     fluticasone propionate; 1 spray into each nostril daily. 1 pulv??risation dans chaque narine par jour       Patient presented for follow up with plans for knee injection. Audio Interpreter services were used.   We reviewed her labs (which were printed and given to her) and tried to gather clarity regarding her medications. I have advised her to call ID to schedule an appointment to follow up with them but tried to re-enforce she needs to be on medications for her HIV. Her TSH is still elevated, I have resent in her thyroid medication as well.     Knee injection was deferred at this time as unsure if pain is truly from OA. Given tenderness, obtaining STAT U/S of RLE but also advised to go to the ED.     She continues to have symptoms from COVID; Printed Rx for Nasal Spray and Tessalon pearls given to patient.   Screening preventative measures deferred.     ED precautions were discussed multiple times with patient.   Return if symptoms worsen or fail to improve.    Subjective:  Subjective    HPI: Joan Burns is a 70 y.o. female here for follow up  Patient speaks [place].   During Encounter, used Barrister's clerk Service: Eveline Keto (french) number [phone]  Lives with [place], lives alone  She is not feeling well today, having issues hearing and a cough.   She reports she was taking all of her medication up til 2 weeks ago. She lives alone in [place] but noted that her daughter does help her with her prescriptions.     She continues to have knee pain. Xrays were not performed.           ROS:  Review of systems negative unless otherwise noted as per HPI.    Objective:  Objective    Vitals:    10/18/21 1100   BP: 152/90   Pulse: 77   Temp: 36.9 ??C (98.4 ??F)   SpO2: 98%     Body mass index is 31.23 kg/m??.    Physical Exam:  Physical Exam     Physical Exam  Constitutional:       General: She is not in acute distress.     Appearance: Normal appearance.   HENT:      Head: Normocephalic and atraumatic.      Nose: Nasal Congestion on exam     Mouth/Throat: moist     Ears: Appears to have bulging TM consistent with ET dysfunction/sinus pressure. No sign of AOM.   Eyes:      General: No scleral icterus.        Right eye: No discharge.         Left eye: No discharge.      Extraocular Movements: Extraocular movements intact.      Pupils: Pupils are  equal, round, and reactive to light.   Pulmonary:      Effort: Pulmonary effort is normal. Cough on exam with mild wheeze  Musculoskeletal:  Right knee is swollen compared to left knee. Noted to have tenderness to palpation of right calf but no unilateral swelling, erythema or warmth noted. Tenderness when testing PROM of right knee as well as lateral joint line.     Lymphadenopathy:      Cervical: No cervical adenopathy.   Skin:     General: Skin is warm.   Neurological:      General: No focal deficit present.      Mental Status: She is alert and oriented to person, place, and time.      Gait: Gait is stable without use of assistance devices.   Psychiatric:         Mood and Affect: appears irritated today.

## 2021-10-18 NOTE — Unmapped (Addendum)
Veuillez vous rendre [employer] urgences si vous vous sentez pire.       Vous recevrez un appel concernant une ??chographie de votre jambe droite

## 2021-10-23 DIAGNOSIS — B20 Human immunodeficiency virus [HIV] disease: Principal | ICD-10-CM

## 2021-10-23 DIAGNOSIS — B009 Herpesviral infection, unspecified: Principal | ICD-10-CM

## 2021-10-23 MED ORDER — VALACYCLOVIR 1 GRAM TABLET
ORAL_TABLET | Freq: Every day | ORAL | 0 refills | 90 days | Status: CP
Start: 2021-10-23 — End: ?

## 2021-10-23 MED ORDER — SULFAMETHOXAZOLE 400 MG-TRIMETHOPRIM 80 MG TABLET
ORAL_TABLET | Freq: Every day | ORAL | 0 refills | 90 days | Status: CP
Start: 2021-10-23 — End: ?
  Filled 2021-10-31: qty 90, 90d supply, fill #0

## 2021-10-23 MED ORDER — BIKTARVY 50 MG-200 MG-25 MG TABLET
ORAL_TABLET | Freq: Every day | ORAL | 0 refills | 90 days | Status: CP
Start: 2021-10-23 — End: ?
  Filled 2021-10-31: qty 90, 90d supply, fill #0

## 2021-10-23 NOTE — Unmapped (Incomplete Revision)
-----   Message from Franki Cabot sent at 10/22/2021  2:32 PM EDT -----  The PAC has received an incoming call requesting medication refill/clarification/question:    Caller: patient  Best callback number: [phone]    Type of request (refill, clarification, question): refill  Name of medication: birktarvy bactrim  Is there a preferred amount requested (e.g. 30 pills, 3 months worth - if no leave blank): 90 day  Desired pharmacy:     [facility]  [address]  [place] [address]  Phone: [phone] Fax: [phone]        Does caller request a callback from a nurse to discuss?: Please give call once completed                                                              RN Follow Up     Medication Requested: Biktarvy and Bactrim- Pt. Needs a 90 day supply. She is going to the [place] on 10[place]14     {Verify that visits pulled are office visits with provider, not nurse visits:75688}  No future appointments.  Per Provider Note:   {Copy and paste in requested medication listed in provider note above:75688}  Standing order protocol requirements met?: No  {Only send to provider if standing order protocol not NGE:95284}  Sent to: Provider for signing    Days Supply Given: 90 days  Number of Refills: TBD by provider      {CARE PARTNERS - Please provider adequate number of refills to get to either next scheduled visit or next recall. For 90 day supply even if they have appt sooner than 90 days it is ok to authorize. Please be mindful of how many refills you are giving for 90 day supplies. For example, 1 fill plus 1 refill of 90 days is 6 months:75688}

## 2021-10-23 NOTE — Unmapped (Signed)
-----   Message from Franki Cabot sent at 10/22/2021  2:32 PM EDT -----  The PAC has received an incoming call requesting medication refill/clarification/question:    Caller: patient  Best callback number: [phone]    Type of request (refill, clarification, question): refill  Name of medication: Alcide Goodness  Is there a preferred amount requested (e.g. 30 pills, 3 months worth - if no leave blank): 90 day  Desired pharmacy:     [facility]  [address]  [place] [address]  Phone: [phone] Fax: [phone]        Does caller request a callback from a nurse to discuss?: Please give call once completed

## 2021-10-23 NOTE — Unmapped (Signed)
Contacted patient's daughter via mobile and confirmed two identifiers.  [name] reports that patient is going to [place], leaving 11/03/21 and unsure she is returning but less than 6 months. She assures me she will make sure her mom comes back before she runs out of medication. I have instructed daughter that IF patient does not come back before refills run out, she should see local HIV doctor for evaluation and treatment until she comes back to [place]. Also stressed importance of taking Bactrim SS, Biktarvy, and Valtrex 1 gram daily. Daughter reassures me that mom is taking all these medications. Patient saw PCP after CoVid and bronchitis infection. CD4 count at that time 168 (10/02/21) and VL 55. In setting of viral infection, CD4 count may be inaccurate. K+ is normal on Bactrim SS. Daughter has agreed to set up appointment with me on 10/10 at 1pm. She will arrange for the transportation and I. Art therapist will call [employer] to make appointment for luba-kasai interpreter.

## 2021-10-30 ENCOUNTER — Ambulatory Visit: Admit: 2021-10-30 | Discharge: 2021-10-31 | Payer: BLUE CROSS/BLUE SHIELD | Attending: Family | Primary: Family

## 2021-10-30 DIAGNOSIS — Z79899 Other long term (current) drug therapy: Principal | ICD-10-CM

## 2021-10-30 DIAGNOSIS — B20 Human immunodeficiency virus [HIV] disease: Principal | ICD-10-CM

## 2021-10-30 DIAGNOSIS — B009 Herpesviral infection, unspecified: Principal | ICD-10-CM

## 2021-10-30 DIAGNOSIS — Z5181 Encounter for therapeutic drug level monitoring: Principal | ICD-10-CM

## 2021-10-30 DIAGNOSIS — Z23 Encounter for immunization: Principal | ICD-10-CM

## 2021-10-30 DIAGNOSIS — Z9189 Other specified personal risk factors, not elsewhere classified: Principal | ICD-10-CM

## 2021-10-30 LAB — CBC W/ AUTO DIFF
BASOPHILS ABSOLUTE COUNT: 0 10*9/L (ref 0.0–0.1)
BASOPHILS RELATIVE PERCENT: 1.1 %
EOSINOPHILS ABSOLUTE COUNT: 0.4 10*9/L (ref 0.0–0.5)
EOSINOPHILS RELATIVE PERCENT: 8.9 %
HEMATOCRIT: 32.9 % — ABNORMAL LOW (ref 34.0–44.0)
HEMOGLOBIN: 10.6 g/dL — ABNORMAL LOW (ref 11.3–14.9)
LYMPHOCYTES ABSOLUTE COUNT: 1.5 10*9/L (ref 1.1–3.6)
LYMPHOCYTES RELATIVE PERCENT: 36.4 %
MEAN CORPUSCULAR HEMOGLOBIN CONC: 32.3 g/dL (ref 32.0–36.0)
MEAN CORPUSCULAR HEMOGLOBIN: 22.8 pg — ABNORMAL LOW (ref 25.9–32.4)
MEAN CORPUSCULAR VOLUME: 70.7 fL — ABNORMAL LOW (ref 77.6–95.7)
MEAN PLATELET VOLUME: 9.5 fL (ref 6.8–10.7)
MONOCYTES ABSOLUTE COUNT: 0.3 10*9/L (ref 0.3–0.8)
MONOCYTES RELATIVE PERCENT: 7.4 %
NEUTROPHILS ABSOLUTE COUNT: 1.9 10*9/L (ref 1.8–7.8)
NEUTROPHILS RELATIVE PERCENT: 46.2 %
NUCLEATED RED BLOOD CELLS: 0 /100{WBCs} (ref <=4)
PLATELET COUNT: 281 10*9/L (ref 150–450)
RED BLOOD CELL COUNT: 4.66 10*12/L (ref 3.95–5.13)
RED CELL DISTRIBUTION WIDTH: 18.4 % — ABNORMAL HIGH (ref 12.2–15.2)
WBC ADJUSTED: 4.1 10*9/L (ref 3.6–11.2)

## 2021-10-30 LAB — BASIC METABOLIC PANEL
ANION GAP: 6 mmol/L (ref 5–14)
BLOOD UREA NITROGEN: 10 mg/dL (ref 9–23)
BUN / CREAT RATIO: 17
CALCIUM: 9 mg/dL (ref 8.7–10.4)
CHLORIDE: 105 mmol/L (ref 98–107)
CO2: 25 mmol/L (ref 20.0–31.0)
CREATININE: 0.58 mg/dL — ABNORMAL LOW
EGFR CKD-EPI (2021) FEMALE: >90 mL/min/{1.73_m2} (ref >=60)
GLUCOSE RANDOM: 121 mg/dL — ABNORMAL HIGH (ref 70–99)
POTASSIUM: 4 mmol/L (ref 3.4–4.8)
SODIUM: 136 mmol/L (ref 135–145)

## 2021-10-30 LAB — AST: AST (SGOT): 17 U/L (ref <=34)

## 2021-10-30 LAB — ALT: ALT (SGPT): 11 U/L (ref 10–49)

## 2021-10-30 LAB — BILIRUBIN, TOTAL: BILIRUBIN TOTAL: 0.4 mg/dL (ref 0.3–1.2)

## 2021-10-30 MED ORDER — VALACYCLOVIR 1 GRAM TABLET
ORAL_TABLET | Freq: Every day | ORAL | 0 refills | 60 days | Status: CP
Start: 2021-10-30 — End: ?
  Filled 2021-10-31: qty 56, 56d supply, fill #0
  Filled 2021-10-31: qty 34, 34d supply, fill #0

## 2021-10-30 NOTE — Unmapped (Signed)
INFECT[facility] DISE[facility]ES CLINIC  [address]  [place], [place]  [address]  P [phone]  F [phone]     Primary care provider: Amie Portland, FNP    Assessment/Plan:      HIV (dx'd 2015, nadir CD4 76/4% in 07/20/20)  - chronic, severe  Patient diagnosed with HIV in 2015 as part of pre-op work up for myomectomy. Per report from daughter, patient had declined treatment and her daughters through the years had tried to get her into care but was unable to, both here in [place] and in [place]. They were unable to establish care for her in [place] because her Medicaid was in [place].    Patient speaks Tshiluba or Luba-Kasai, a [place] language, that can be accessed by [employer] (via telephone [phone]) if an appointment is made with them. Patient actually speaks a combination of Swahili and Tshiluba but prefers interpretation in Tshiluba/Luba-Kasai. [place] interpreter can be used if Tsiluba/Luba-kasai interpreter cannot be found. Does not prefer Swahili.    Attempted to schedule Luba-Kasai interpreter for this appointment but unable to access proper interpreter. [place] interpreter used which was patient's preference. Patient is unaccompanied.    Overall doing well. Current regimen: Biktarvy (BIC/FTC/TAF)  Misses doses of ARVs never when she has supply but reports she has run out x 7 days    Med access via Medicaid  CD4 count  102/6% after starting Biktarvy , on Bactrim SS daily. Repeat CD4 today. Will stop Bactrim if continues to be virally suppressed.  Discussed ARV adherence and taking ARVs with food    Lab Results   Component Value Date    ACD4 168 (L) 10/02/2021    CD4 8 (L) 10/02/2021    HIVRS Detected (A) 10/02/2021    HIVCP 55 (H) 10/02/2021     CD4, HIV RNA, and safety labs (full return panel) Has been without medication x 1 week. There has been some question as to how patient has had supply of medications even though last delivery by [facility] was in 04/2021. But when questioned patient about this she reports that she has not had medication interruption other than this past week.  CD4 repeated by PCP 10/02/21 showing CD4 count 168/8% and slightly detectable at 55. This supports the fact that patient has been on medications. However, numbers checked during viral illness (Covid) which may skew numbers and have opted to repeat labs today.  Patient has been virally suppressed since 08/2020 and would have been able to stop Bactrim in 03/2021, despite having CD4 count less than 200. However, was lost to follow up and labs checked in 09/2021 with viral load at 55. Will see what her numbers are like today though likely detectable since she has been off ART x at least 1 week.  Continue current therapy On Biktarvy and Bactrim SS, tolerating well. Refills provided.  Discussed importance of ARV adherence  Travelling to [place] for 3 months. Able to secure supply of Biktarvy and Bactrim SS x 90 days which will be mailed to patient tomorrow. Insurance covers for 34 days of Valtrex and remaining amount is $800. I will see about securing her medications to complete 90 day supply another way. [facility] able to supply Valtrex 1gram #56 for about $48 at [facility]. Unsure if patient can afford this and whether or not she is able to pick up medication. Will reach out to [facility] pharmacy to see if they would honor [place] coupon. Advised daughter and patient that if  patient happens to stay in [place] longer than 3 months and runs out of medicines, she will need to seek care with local HIV provider to get the appropriate medications. Patient and [name] verbalize undestanding of this and will follow up accordingly.       HIV retinopathy  -  chronic    Diagnosed by Ophthalmology, Dr. Larry Sierras  Incidental finding of CWS when patient being evaluated for cataracts.  Daughter reports that patient has not complained of vision changes prior to this diagnosis.  Continue close follow up with Ophthalmology, next appointment due 06/2021 but has not followed up      Recent CoVid infection (dx'd 09/25/21) - acute, self-limited  Evaluated at [facility] with the following symptoms: congestion, sinus pressure, nonproductive cough, and some chills.   Prescribed molnupiravir and tessalon perles- took the medications  Because of recent infection, no need for CoVid booster at this time.      +HepC Ab/HCV RNA not detected (03/2020)  Consider baseline ultrasound.   Confirm no treatment for Hep C      Right knee pain - chronic, exacerbated  Patient has chronic right knee complaints, evaluated by ED for it on 09/25/21 (advised to see PCP about this) and Dr. Rosine Beat with [facility] on 10/02/21.   [place] PVL ordered for patient and has appointment 10/31/21.  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  Right knee today is slightly swollen and tender somewhat to touch though no signs of infection. Has pain with opposing forces to RLE.  At last visit with Dr. Rosine Beat, imaging ordered with possibility for knee injection. However, since patient is leaving for [place] for at least 3 months on 11/03/21, secured appointment about knee pain with Same Day Clinic at [facility] on 10/31/21 at 1pm.  Have arranged with daughter, [name], that Medicaid ride will drop patient off at [facility] at 12:30pm for Same Day appointment and will pick up from [address] at [employer] after her PVL appointment.   Bonnita Hollow, RN, ID clinic manager, has authorized for patient to have Lyft ride from [place] to [address] to get her between appointments. This was necessary since patient only speaks foreign language without easy access to interpretation for these rides and would help ensure patient gets timely care of her needs before her overseas trip.      HSV-2 infection/Perianal lesions  -  resolved  Reports that lesion started by itching, which became a vesicle and then because of itching, the lesions opened. Watery, clear discharge has been draining since then, leasions seems to be getting bigger. Not experiencing pain, but is itchy.   07/11/20 Evaluated by PCP with surface swab revealing mixed gram positive organisms. Started on Bactrim SS BID.  Dr. Matt Holmes in to evaluate patient along with myself. Skin erosions are characteristic of HSV infection that may also be superinfected with bacteria.  Patient given Valtrex 1000mg  daily for HSV suppression. Has run out but would like to continue suppression. Since patient still has low CD4 count, I agree with this and have [facility]. See above regarding issues with supply of medication.      Uterine leiomyoma - chronic  Had uterine surgery in 2014 per patient report  Seen by GYN 12/27/20 and no further evaluation needed at this time other than routine well woman exam.  Hypothyroidism - chronic, stable  Managed by PCP      Alcohol Abuse - chronic - not addressed today  Drinks 5 beers or wine a day  Since being on antibiotics, patient drinks 1-2 drinks daily.      Reported history of Bell's Palsy and Shingles outbreak      Sexual health & secondary prevention  -  abstinent  Not in relationship. Last sexual encounter >5 years ago, remains abstinent  She  is abstinent  She does not routinely discuss HIV status with partner(s).  Has children and post menopausal .    Lab Results   Component Value Date    RPR Nonreactive 07/20/2020    RPR Nonreactive 06/29/2020     GC/CT NAATs - not being checked routinely for this patient  RPR - repeat 12 months after prior, due 06/2021, needs to be collected at next visit.      Health maintenance  - chronic, acutely exacerbated  PCP: Ali Lowe, FNP     Oral health  She  unknown  have a dentist. Last dental exam unknown.    Eye health  She does  use corrective lenses. Last eye exam followed by Opthalmology.    Metabolic conditions  Wt Readings from Last 5 Encounters:   10/30/21 71.7 kg (158 lb) 10/18/21 70.2 kg (154 lb 11.2 oz)   10/02/21 69.2 kg (152 lb 9.6 oz)   04/16/21 70.3 kg (155 lb)   03/28/21 69.4 kg (153 lb)     Lab Results   Component Value Date    CREATININE 0.58 (L) 10/30/2021    GLUCOSEU Negative 07/20/2020    GLU 121 (H) 10/30/2021    ALT 11 10/30/2021    ALT 18 10/02/2021    ALT 14 03/28/2021     # Kidney health - creatinine today  # Bone health - defer mgm't to PCP, on Fosamax  # Diabetes assessment - random glucose today  # NAFLD assessment - monitor over time    Communicable diseases  Lab Results   Component Value Date    QFTTBGOLD Negative 06/29/2020    HEPAIGG Reactive (A) 07/20/2020    HEPBSAB Reactive (A) 07/20/2020    HEPCAB Reactive (A) 04/14/2020    HCVRNA Not Detected 04/14/2020     # TB screening - no longer needed; negative IGRA, low risk  # Hepatitis screening -  as noted:  See above  # MMR screening - not assessed    Cancer screening  No results found for: PSASCRN, PSA, PAP, FINALDX  # Cervical -  Defer management to GYN  # Breast -  Defer management to GYN    # Anorectal -  will disucss  # Colorectal -  Defer to PCP  # Liver - no screening indicated  # Lung - screening not indicated    Cardiovascular disease  Lab Results   Component Value Date    CHOL 172 03/28/2021    HDL 48 03/28/2021    LDL 99 03/28/2021    NONHDL 124 03/28/2021    TRIG 126 03/28/2021     # The 10-year ASCVD risk score (Arnett DK, et al., 2019) is: 10%  - is not taking aspirin   - is not taking statin  - BP control good  - never smoker  # AAA screening - no indication for screening    Immunization History   Administered Date(s) Administered    COVID-19 VACCINE,MRNA(MODERNA)(PF) 10/06/2020, 11/03/2020    Influenza Vaccine Quad (IIV4 PF)  4mo+ injectable 10/30/2021     Immunizations today - Amenable to flu vaccine today. Explained risk/benefit to flu vaccine.  Patient received primary CoVid series and reports getting subsequent CoVid boosters through her work place.    I personally spent 60 minutes face-to-face and non-face-to-face in the care of this patient, which includes all pre, intra, and post visit time on the date of service.   Time for translation extended out our discussions.      Disposition  Next appointment: 4 months, will attempt to secure Tshiluba/ Luba-Kasai interpreter for this visit      To do @ next RTC  Discuss anal pap.  Alcohol history  RPR  Consider abdominal ultrasound      Varney Daily, FNP-BC  [facility]  [address], [place], [place] [address]    Phone: [phone]   Fax: [phone]             Subjective      Chief Complaint   HIV follow up    HPI  In addition to details in A&P above:    Daughter lives in [place] at this time and will travel back and forth occasionally to help with health care for patient. Patient has 3 daughters but [name] is her primary caregiver out of her daughters.  Denies any fever, chills, nausea, vomiting, rash, urinary complaints, diarrhea  Feeling good overall, denies any side effects from medications  Has knee pain as above.      Past Medical History:   Diagnosis Date    Herpes simplex infection of perianal skin     HIV disease (CMS-HCC)     Other osteoporosis without current pathological fracture     Other specified hypothyroidism        Social History  Background - From [place]. Works at [employer] at this time. Currently lives in [place], [place].    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 All others negative.      Medications and Allergies  She has a current medication list which includes the following prescription(s): alendronate, benzonatate, biktarvy, calcium carbonate-vitamin d3, cetirizine, fluticasone propionate, lagevrio (eua), levothyroxine, polyvinyl alcohol/povidone/pf, sulfamethoxazole-trimethoprim, triamcinolone, valacyclovir, and valacyclovir.    Allergies: Tetanus-diphtheria toxoids-td      Family History  Her family history is not on file.          Objective:      BP 149/84 (BP Site: L Arm, BP Position: Sitting, BP Cuff Size: Large)  - Pulse 73  - Temp 36.7 ??C (98 ??F) (Temporal)  - Ht 149.9 cm (4' 11)  - Wt 71.7 kg (158 lb)  - BMI 31.91 kg/m??   Wt Readings from Last 3 Encounters:   10/30/21 71.7 kg (158 lb)   10/18/21 70.2 kg (154 lb 11.2 oz)   10/02/21 69.2 kg (152 lb 9.6 oz)       Const looks well and attentive, alert, appropriate   Eyes sclerae anicteric, noninjected OU   ENT no thrush, leukoplakia or oral lesions   Lymph no cervical or supraclavicular LAD  CV RRR. No murmurs. No rub or gallop. S1/S2.   Lungs CTAB ant/post, normal work of breathing   GI Soft, no organomegaly. NTND. NABS.   GU deferred   Rectal deferred   Skin no petechiae, ecchymoses or obvious rashes on clothed exam   MSK normal ROM throughout and bilateral knees have pain with certain movements to opposition. Strength 5/5 in left knee but 4/5 in R knee due to pain with opposing force.   Neuro Grossly intact   Psych Appropriate affect. Eye contact good. Linear thoughts. Fluent speech.     Laboratory Data  Reviewed in Epic today, using Synopsis and Chart Review filters.    Lab Results   Component Value Date    CREATININE 0.58 (L) 10/30/2021    QFTTBGOLD Negative 06/29/2020    HEPCAB Reactive (A) 04/14/2020    HCVRNA Not Detected 04/14/2020    CHOL 172 03/28/2021    HDL 48 03/28/2021    LDL 99 03/28/2021    NONHDL 124 03/28/2021    TRIG 126 03/28/2021

## 2021-10-30 NOTE — Unmapped (Signed)
[facility] Clinical Assessment & Refill Coordination Note    Joan Burns, DOB: 11-Oct-1950  Phone: [phone] (home)     All above HIPAA information was verified with patient and her provider Varney Daily, FNP.     Was a Nurse, learning disability used for this call? No    Specialty Medication(s):   Infectious Disease: Biktarvy     Current Outpatient Medications   Medication Sig Dispense Refill    alendronate (FOSAMAX) 70 MG tablet Take 1 tablet (70 mg total) by mouth every seven (7) days. 4 tablet 11    benzonatate (TESSALON) 100 MG capsule prendre un comprim?? toutes les 8 heures au besoin pour la toux 20 capsule 0    bictegrav-emtricit-tenofov ala (BIKTARVY) 50-200-25 mg tablet Take 1 tablet by mouth in the morning. 90 tablet 0    calcium carbonate-vitamin D3 600 mg-10 mcg (400 unit) per tablet Take 1 tablet by mouth daily.      cetirizine (ZYRTEC) 10 MG tablet Take 1 tablet (10 mg total) by mouth daily.      fluticasone propionate (FLONASE) 50 mcg/actuation nasal spray 1 spray into each nostril daily. 1 pulv??risation dans chaque narine par jour 16 g 0    LAGEVRIO, EUA, 200 mg capsule TAKE 4 CAPSULES BY MOUTH TWICE A DAY X 5 DAYS      levothyroxine (SYNTHROID) 100 MCG tablet Take 1 tablet (100 mcg total) by mouth daily. 90 tablet 0    polyvinyl alcohol/povidone/PF (REFRESH CLASSIC, PF, OPHT) Apply to eye.      sulfamethoxazole-trimethoprim (BACTRIM) 400-80 mg per tablet Take 1 tablet (80 mg of trimethoprim total) by mouth in the morning. 90 tablet 0    triamcinolone (KENALOG) 0.1 % cream Apply to affected area twice a day for 1 month 30 g 1    valACYclovir (VALTREX) 1000 MG tablet Take 1 tablet (1,000 mg total) by mouth daily. 90 tablet 0    valACYclovir (VALTREX) 1000 MG tablet Take 1 tablet (1,000 mg total) by mouth daily. 60 tablet 0     No current facility-administered medications for this visit.        Changes to medications: Terrill reports no changes at this time.    Allergies Allergen Reactions    Tetanus-Diphtheria Toxoids-Td Rash       Changes to allergies: No    SPECIALTY MEDICATION ADHERENCE     Biktarvy 50-200-25 mg: 0 days of medicine on hand       Medication Adherence    Patient reported X missed doses in the last month: 7  Specialty Medication: Biktarvy 50-200-25mg   Patient is on additional specialty medications: No  Demonstrates understanding of importance of adherence: yes  Informant: patient  Provider-estimated medication adherence level: good  Patient is at risk for Non-Adherence: Yes   Other non-adherence reason: difficulty in communciations through her family member who lives in [place].  Ran out of meds while SSC waiting on call back from her daughter.     Specialty medication(s) dose(s) confirmed: Regimen is correct and unchanged.     Are there any concerns with adherence?  She is good when she hasn't run out of meds.  Concerned about difficulty in communications.    Adherence counseling provided? Not needed    CLINICAL MANAGEMENT AND INTERVENTION      Clinical Benefit Assessment:    Do you feel the medicine is effective or helping your condition? Yes    HIV ASSOCIATED LABS:     New labs are in  process from today's appointment.    Lab Results   Component Value Date/Time    HIVRS Detected (A) 10/02/2021 09:13 AM    HIVRS Not Detected 04/16/2021 09:22 AM    HIVRS Not Detected 09/15/2020 04:56 PM    HIVCP 55 (H) 10/02/2021 09:13 AM    HIVCP 12,564 (H) 07/20/2020 01:20 PM    ACD4 168 (L) 10/02/2021 09:13 AM    ACD4 120 (L) 04/16/2021 09:22 AM    ACD4 102 (L) 09/15/2020 04:56 PM       Clinical Benefit counseling provided? Not needed    Adverse Effects Assessment:    Are you experiencing any side effects? No    Are you experiencing difficulty administering your medicine? No    Quality of Life Assessment:    Quality of Life    Rheumatology  Oncology  Dermatology  Cystic Fibrosis          How many days over the past month did your HIV  keep you from your normal activities? For example, brushing your teeth or getting up in the morning. 0    Have you discussed this with your provider? Not needed    Acute Infection Status:    Acute infections noted within Epic:  No active infections  Patient reported infection: None    Therapy Appropriateness:    Is therapy appropriate and patient progressing towards therapeutic goals? Yes, therapy is appropriate and should be continued    DISEASE/MEDICATION-SPECIFIC INFORMATION      N/A    HIV: Not Applicable    PATIENT SPECIFIC NEEDS     Does the patient have any physical, cognitive, or cultural barriers? No    Is the patient high risk? No    Did the patient require a clinical intervention? No    Does the patient require physician intervention or other additional services (i.e., nutrition, smoking cessation, social work)? No    SOCIAL DETERMINANTS OF HEALTH     At the [facility], we have learned that life circumstances - like trouble affording food, housing, utilities, or transportation can affect the health of many of our patients.   That is why we wanted to ask: are you currently experiencing any life circumstances that are negatively impacting your health and/or quality of life? Patient declined to answer    Social Determinants of Health     Financial Resource Strain: High Risk (03/17/2021)    Overall Financial Resource Strain (CARDIA)     Difficulty of Paying Living Expenses: Hard   Internet Connectivity: Not on file   Food Insecurity: Food Insecurity Present (03/17/2021)    Hunger Vital Sign     Worried About Running Out of Food in the Last Year: Sometimes true     Ran Out of Food in the Last Year: Sometimes true   Tobacco Use: Low Risk  (10/18/2021)    Patient History     Smoking Tobacco Use: Never     Smokeless Tobacco Use: Never     Passive Exposure: Not on file   Housing/Utilities: High Risk (03/17/2021)    Housing/Utilities     Within the past 12 months, have you ever stayed: outside, in a car, in a tent, in an overnight shelter, or temporarily in someone else's home (i.e. couch-surfing)?: Yes     Are you worried about losing your housing?: Yes     Within the past 12 months, have you been unable to get utilities (heat, electricity) when it was really needed?: Yes  Alcohol Use: Heavy Drinker (04/14/2020)    Alcohol Use     How often do you have a drink containing alcohol?: 4+ times per week     How many drinks containing alcohol do you have on a typical day when you are drinking?: 10+     How often do you have 5 or more drinks on one occasion?: Daily or almost daily   Transportation Needs: Unmet Transportation Needs (03/17/2021)    PRAPARE - Therapist, art (Medical): Yes     Lack of Transportation (Non-Medical): Yes   Substance Use: Not on file   Health Literacy: Not on file   Physical Activity: Not on file   Interpersonal Safety: Not on file   Stress: Not on file   Intimate Partner Violence: Not on file   Depression: Not at risk (03/28/2021)    PHQ-2     PHQ-2 Score: 0   Social Connections: Not on file       Would you be willing to receive help with any of the needs that you have identified today? Not applicable       SHIPPING     Specialty Medication(s) to be Shipped:   Infectious Disease: Biktarvy    Other medication(s) to be shipped:   Valacyvlovir 1000 mg  Sulfamethoxazole-trimethopriom 400-80mg   Levothyroxine  Alendronate 70mg      Changes to insurance: No    Delivery Scheduled: Yes, Expected medication delivery date: 11/01/21.     Medication will be delivered via UPS to the confirmed temporary address in [facility].    The patient will receive a drug information handout for each medication shipped and additional FDA Medication Guides as required.  Verified that patient has previously received a Conservation officer, historic buildings and a Surveyor, mining.    The patient or caregiver noted above participated in the development of this care plan and knows that they can request review of or adjustments to the care plan at any time. All of the patient's questions and concerns have been addressed.    Roderic Palau, PharmD   [facility] Specialty Pharmacist

## 2021-10-31 ENCOUNTER — Ambulatory Visit: Admit: 2021-10-31 | Discharge: 2021-11-01 | Payer: BLUE CROSS/BLUE SHIELD

## 2021-10-31 LAB — LYMPH MARKER LIMITED,FLOW
ABSOLUTE CD3 CNT: 1245 {cells}/uL (ref 915–3400)
ABSOLUTE CD4 CNT: 120 {cells}/uL — ABNORMAL LOW (ref 510–2320)
ABSOLUTE CD8 CNT: 1095 {cells}/uL (ref 180–1520)
CD3% (T CELLS): 83 % (ref 61–86)
CD4% (T HELPER): 8 % — ABNORMAL LOW (ref 34–58)
CD4:CD8 RATIO: 0.1 — ABNORMAL LOW (ref 0.9–4.8)
CD8% T SUPPRESR: 73 % — ABNORMAL HIGH (ref 12–38)

## 2021-10-31 LAB — HIV RNA, QUANTITATIVE, PCR: HIV RNA QNT RSLT: NOT DETECTED

## 2021-10-31 MED FILL — ALENDRONATE 70 MG TABLET: ORAL | 28 days supply | Qty: 4 | Fill #4

## 2021-11-02 ENCOUNTER — Ambulatory Visit: Admit: 2021-11-02 | Discharge: 2021-11-03 | Payer: BLUE CROSS/BLUE SHIELD

## 2021-11-02 DIAGNOSIS — M2550 Pain in unspecified joint: Principal | ICD-10-CM

## 2021-11-02 MED ORDER — IBUPROFEN 600 MG TABLET
ORAL_TABLET | Freq: Four times a day (QID) | ORAL | 3 refills | 30 days | Status: CP | PRN
Start: 2021-11-02 — End: 2022-03-02

## 2021-11-02 NOTE — Unmapped (Addendum)
Joan Burns,    Ce fut un plaisir de vous voir ?? la Same Day Clinic aujourd'hui. Voici ce dont nous avons discut?? aujourd???hui :  Steele Sizer essayer de prendre 600 mg d'ibuprof??ne toutes les 6 heures avec de la nourriture, si n??cessaire pour soulager les douleurs articulaires. L'ordonnance a ??t?? envoy[facility]e au [facility] [place] sur [address].  -Nous orienterons vers une th??rapie physique pour soulager les douleurs ?? l'??paule, au coude et au genou.  -Nous fixerons un rendez-vous de suivi dans 6 semaines pour nous assurer que vous vous sentez mieux apr??s avoir commenc?? le traitement par l'ibuprof??ne.    Meilleur,  Janetta Hora, MD        Joan Burns,    It was a pleasure seeing you in Same Day Clinic today. Here is what we discussed today:  -Please try taking ibuprofen 600 mg every 6 hours with food as needed for the joint pain. [facility] [place] [facility] [address]  -We will place a referral to physical therapy to help with the shoulder, elbow, and knee pain.  -We will schedule a follow-up appointment in 6 weeks to make sure you are feeling better after starting the ibuprofen.    Best,  Janetta Hora, MD

## 2021-11-02 NOTE — Unmapped (Signed)
INTERNAL MEDICINE ADVANCED SAME DAY CLINIC NOTE     11/02/2021    PCP: Amie Portland, FNP     Assessment and Plan    Joan Burns was seen today for knee pain.    Diagnoses and all orders for this visit:    Polyarthralgia  -     Ambulatory referral to Physical Therapy; Future    Other orders  -     ibuprofen (MOTRIN) 600 MG tablet; Take 1 tablet (600 mg total) by mouth every six (6) hours as needed for pain.    Joan Burns presented  with 1 month of polyarthralgia that is worse in bilateral shoulders, elbows, ankles, and R knee. Recent R knee Xray showed mild OA and small joint effusion, PVLs negative. Diffuse polyarthralgia that is largely symmetric, worse in AM and improves over next few hours with movement. Pain also  worse after long day of work. ANA was positive in 06/2020 after initial HIV diagnosis with 1:80 titer and speckled pattern. Rheumatoid factor and ANCA were normal, while ESR was elevated to 84. Given largely symmetric pattern  of  diffuse polyarthralgia, hx of positive  ANA, and worse  in AM with improvement  with starting  daily activities, highest concern for  underlying  inflammatory arthritis.  -Placed referral to PT, but pt may start after returning from international trip   -Sent prescription for ibuprofen 600 mg q6h PRN  -Recommended follow-up in about 6  weeks to monitor for symptomatic improvement  -Pt deferred lab work today, may need labs in future (CK, ESR, CRP, ANA, anti-CCP) + rheum referral if positive  and high suspicion for autoimmune process    Follow up in 6 weeks or earlier as needed.    Patient was seen and discussed with Dr. Luster Landsberg who is in agreement with the assessment and plan as outlined above.     Subjective    Problem List:  Patient Active Problem List   Diagnosis    Dermatitis    Alcohol abuse    Uterine leiomyoma    Hearing loss    HIV infection (CMS-HCC)    History of Bell's palsy    Hyperlipidemia    Anemia    Hypothyroidism    Pressure injury of buttock, stage 2 (CMS-HCC)    Retinopathy    Osteoporosis     HPI  Joan Burns is a 71 y.o. year old female with the above problem list who presents to the same day clinic for   Chief Complaint   Patient presents with    Knee Pain     Right     Joan Burns is a 71yo F with history of HIV (CD4 120, viral load undetectable on 10/10) on Biktarvy, hypothyroidism, and osteoporosis presents with 1 month of diffuse polyarthralgia. About 1 month ago, she started having diffuse joint pain in her bilateral shoulders, elbows, ankles, and right knee. She had a right knee Xray performed at [facility] which showed mild osteoarthritis with a small joint effusion. She followed up with Dr. Rosine Beat on 9/28, PVLs were ordered and were negative for a blood clot. She has had no fevers, chills, constipation, diarrhea, dysuria, or hematuria. She had a viral URI about 10 days after onset of diffuse polyarthralgia, but those symptoms have completely resolved. She has also had myalgias in the muscles near the joints where she is having polyarthralgia. Pain is worse in the morning right after she wakes up, but she finds that after moving around home  for a little while the pain improves. The pain is also worse after a long day at work. Of note, ANA was positive in 06/2020 after initial HIV diagnosis with 1:80 titer and speckled pattern. Rheumatoid factor and ANCA were normal, while ESR was elevated to 84. These labs have not been repeated since.    Meds and allergies were reviewed in Epic    ROS: 10 point ROS was performed and is otherwise negative other than mentioned in the HPI    Objective  PE:  Vitals:    11/02/21 0950   BP: 146/77   Pulse: 84   Resp: 18   Temp: 36.4 ??C (97.6 ??F)   SpO2: 99%     General: well-appearing in NAD  Eyes: EOMI, sclera clear, PERRL  CV: RRR, No M/R/G  Resp: CTAB, no wheezes or crackles, normal WOB  GI: soft, NTND  MSK: Full ROM, but pain with movement of shoulders, elbows, and R knee. 5/5 strength in all extremities. TTP over bilateral shoulders, elbows, ankles, and  R knee. No warmth or erythema noted over  any joint. No significant joint effusions noted. Myalgias over surrounding  musculature.  Skin: clean and dry, no rashes or lesions noted    Procedure: None  See procedure note from this encounter  _______________________________________________________________________________________    Same Day Metric Tracker:    Same Day Metric Tracker:  Referral source for today's visit:   Self referral    Referral to other outpatient urgent services? N/A    Did today's visit result in referral to ED or direct admission? No

## 2021-11-05 NOTE — Unmapped (Signed)
I saw and evaluated the patient, participating in the key portions of the service.  I reviewed the resident???s note.  I agree with the resident???s findings and plan. Charna Elizabeth, MD

## 2022-01-17 NOTE — Unmapped (Addendum)
Name: Joan Burns  Date: 01/16/2022  Address: [address]  [place] [address]   [place] of Residence:  [place]  Phone: [phone]     Started assessment with patient options: over the phone     Is this the same address for mailing? Yes  If No, Mailing Address is:     Librarian, academic    Tax Filing Status  I did not file taxes     Employment Status  Medically Unable to Work    Income  No Household Income/Deductions of any kind    If no or low income, how are you meeting your basic needs?  Family Support    List Tax Household Members including relationship to you:   N/A    Someone in my household receives: Not Applicable (for household members)  Specify who: N/A    Medication Access/Barriers: None    Do you have a current diagnosis for Hepatitis C?  Lab Results   Component Value Date    HEPCAB Reactive (A) 04/14/2020    HCVRNA Not Detected 04/14/2020       Have you used tobacco products four or more times per week in the last six months?  No    Teacher, adult education  Patient has affordable insurance through [employer], [place], and or Employment and is not eligible.    Patient given ACA education if they qualified based on answers to questions above.     MyChart  Do you have an active MyChart account? Yes     If MyChart is not set up, informed patient on how to set up MyChart N/A    Patient was informed of the following programs;   N/A    The following applications/handouts were given to patient:   N/A    The following forms were also started with the patient:   N/A    Juanell Fairly application status: Complete    Patient is applying for [employer] on Charges Only     Additional Comments: Spoke with daughter of patient that is aware of patient's status and situation. Patient is currently in [place].       Mickle Asper,  Benefits & Eligibility Coordinator  Time of Intervention: 5 minutes

## 2022-01-26 NOTE — Unmapped (Addendum)
Patient completed [employer] application. Eligible for RW B&C grant services and Caps on Charges. IPL= 0%, FPL= 0%. Expires 01/16/2023    RW Eligibility Form informing patient about RW services and Caps on charges was sent to patient via MyChart Message        Mickle Asper,  Benefits & Eligibility Coordinator  Time of Intervention: 2 minutes

## 2022-03-07 NOTE — Unmapped (Signed)
The [facility] has made a second and final attempt to reach this patient to refill the following medication:Biktarvy .      We have left voicemails on the following phone numbers: [phone], have sent a MyChart message, and have sent a Mychart questionnaire..    Dates contacted: 01/10,02/15  Last scheduled delivery: 10/11    The patient may be at risk of non-compliance with this medication. The patient should call the [facility] at [phone]  Option 4, then Option 2 (all other specialty patients) to refill medication.    Joan Burns   [facility] Specialty Technician

## 2022-03-11 DIAGNOSIS — B20 Human immunodeficiency virus [HIV] disease: Principal | ICD-10-CM

## 2022-03-11 DIAGNOSIS — M810 Age-related osteoporosis without current pathological fracture: Principal | ICD-10-CM

## 2022-03-11 MED ORDER — ALENDRONATE 70 MG TABLET
ORAL_TABLET | ORAL | 11 refills | 28 days | Status: CP
Start: 2022-03-11 — End: 2023-03-11
  Filled 2022-03-20: qty 12, 84d supply, fill #0

## 2022-03-11 MED ORDER — LEVOTHYROXINE 100 MCG TABLET
ORAL_TABLET | Freq: Every day | ORAL | 0 refills | 90 days
Start: 2022-03-11 — End: 2022-06-09

## 2022-03-11 MED ORDER — BIKTARVY 50 MG-200 MG-25 MG TABLET
ORAL_TABLET | Freq: Every day | ORAL | 0 refills | 90 days | Status: CP
Start: 2022-03-11 — End: ?
  Filled 2022-03-20: qty 90, 90d supply, fill #0

## 2022-03-11 NOTE — Unmapped (Signed)
Contacted patient's daughter via mobile about message received from Shared [facility] that patient was not able to be contacted for medication delivery.  Joan Burns (daughter) reports that patient is still out of the country and plans to return this Wednesday (2/21) and will be staying with her in [place] first and plans to come back to [facility] on Saturday 2/24. Advised [name] to call [facility] to update them about patient's whereabouts and when they can deliver medication. [name] reports she talks to her mother almost daily and has not been sick but having too much fun related to alcohol. Otherwise patient doing well. [name] also says she will reschedule appointment with me once her mother is back in the country.

## 2022-03-11 NOTE — Unmapped (Signed)
[facility] Refill Coordination Note    Specialty Medication(s) to be Shipped:   Infectious Disease: Biktarvy    Other medication(s) to be shipped:  levothyroxine, alendronate, valacyclovir     Joan Burns, DOB: May 02, 1950  Phone: [phone] (home)       All above HIPAA information was verified with patient's family member, daughter Joan Burns.     Was a Nurse, learning disability used for this call? No    Completed refill call assessment today to schedule patient's medication shipment from the [facility] [phone]).  All relevant notes have been reviewed.     Specialty medication(s) and dose(s) confirmed: Regimen is correct and unchanged.   Changes to medications: Joan Burns reports no changes at this time.  Changes to insurance: No  New side effects reported not previously addressed with a pharmacist or physician: None reported  Questions for the pharmacist: No    Confirmed patient received a Conservation officer, historic buildings and a Surveyor, mining with first shipment. The patient will receive a drug information handout for each medication shipped and additional FDA Medication Guides as required.       DISEASE/MEDICATION-SPECIFIC INFORMATION        N/A    SPECIALTY MEDICATION ADHERENCE     Medication Adherence    Patient reported X missed doses in the last month: all  Specialty Medication: BIKTARVY 50-200-25 mg tablet (bictegrav-emtricit-tenofov ala)- last filled 10/31/21 for 90 days  Patient is on additional specialty medications: No  Patient is on more than two specialty medications: No  Any gaps in refill history greater than 2 weeks in the last 3 months: no  Demonstrates understanding of importance of adherence: yes  Informant: other relative  Reliability of informant: reliable  Provider-estimated medication adherence level: good  Patient is at risk for Non-Adherence: No  Reasons for non-adherence: no problems identified              Were doses missed due to medication being on hold? No    BIKTARVY 50-200-25 mg tablet (bictegrav-emtricit-tenofov ala)  : 0 days of medicine on hand missed several doses. [place]. Returns on March 21, 2022      REFERRAL TO PHARMACIST     Referral to the pharmacist: Not needed      [facility]     Shipping address confirmed in Epic.     Patient was notified of new phone menu : No    Delivery Scheduled: Yes, Expected medication delivery date: 02/2922.  However, Rx request for refills was sent to the provider as there are none remaining.     Medication will be delivered via UPS to the temporary address in Epic WAM.    Joan Burns' [facility] Joan Burns [facility] Specialty Technician

## 2022-03-20 MED FILL — VALACYCLOVIR 1 GRAM TABLET: ORAL | 30 days supply | Qty: 30 | Fill #1

## 2022-04-11 DIAGNOSIS — B20 Human immunodeficiency virus [HIV] disease: Principal | ICD-10-CM

## 2022-04-11 MED ORDER — BIKTARVY 50 MG-200 MG-25 MG TABLET
ORAL_TABLET | Freq: Every day | ORAL | 1 refills | 90 days
Start: 2022-04-11 — End: ?

## 2022-04-12 MED ORDER — BIKTARVY 50 MG-200 MG-25 MG TABLET
ORAL_TABLET | Freq: Every day | ORAL | 0 refills | 90 days | Status: CP
Start: 2022-04-12 — End: ?
  Filled 2022-06-18: qty 90, 90d supply, fill #0

## 2022-04-12 NOTE — Unmapped (Signed)
Medication Requested: Biktary      No future appointments.  Per Provider Note: Current regimen: Biktarvy (BIC/FTC/TAF)     Standing order protocol requirements met?: No    Sent to: Provider for signing    Days Supply Given: 0  Number of Refills: 0

## 2022-04-24 NOTE — Unmapped (Signed)
Joan Burns has been contacted in regards to their refill of Biktarvy. AA delivery is not due at this time due to  filling a 90 day supply on 03/20/22 . Refill assessment call date has been updated per the patient's request.

## 2022-06-13 DIAGNOSIS — B009 Herpesviral infection, unspecified: Principal | ICD-10-CM

## 2022-06-13 MED ORDER — VALACYCLOVIR 1 GRAM TABLET
ORAL_TABLET | Freq: Every day | ORAL | 0 refills | 90 days
Start: 2022-06-13 — End: 2022-06-20

## 2022-06-13 NOTE — Unmapped (Signed)
[facility] Refill Coordination Note    Specialty Medication(s) to be Shipped:   Infectious Disease: Biktarvy    Other medication(s) to be shipped:  alendronate and valacyclovir     Joan Burns, DOB: April 20, 1950  Phone: [phone] (home)       All above HIPAA information was verified with patient's family member, daughter.     Was a Nurse, learning disability used for this call? No    Completed refill call assessment today to schedule patient's medication shipment from the [facility] [phone]).  All relevant notes have been reviewed.     Specialty medication(s) and dose(s) confirmed: Regimen is correct and unchanged.   Changes to medications: Morris reports no changes at this time.  Changes to insurance: No  New side effects reported not previously addressed with a pharmacist or physician: None reported  Questions for the pharmacist: No    Confirmed patient received a Conservation officer, historic buildings and a Surveyor, mining with first shipment. The patient will receive a drug information handout for each medication shipped and additional FDA Medication Guides as required.       DISEASE/MEDICATION-SPECIFIC INFORMATION        N/A    SPECIALTY MEDICATION ADHERENCE     Medication Adherence    Patient reported X missed doses in the last month: 0  Specialty Medication: BIKTARVY 50-200-25 mg tablet (bictegrav-emtricit-tenofov ala)  Patient is on additional specialty medications: No  Patient is on more than two specialty medications: No  Any gaps in refill history greater than 2 weeks in the last 3 months: no  Demonstrates understanding of importance of adherence: yes  Informant: patient  Confirmed plan for next specialty medication refill: delivery by pharmacy  Refills needed for supportive medications: not needed          Refill Coordination    Has the Patients' Contact Information Changed: No  Is the Shipping Address Different: No         Were doses missed due to medication being on hold? No    BIKTARVY 50-200-25   mg:  days of medicine on hand       REFERRAL TO PHARMACIST     Referral to the pharmacist: Not needed      [facility]     Shipping address confirmed in Epic.       Delivery Scheduled: Yes, Expected medication delivery date: 06/19/2022.     Medication will be delivered via UPS to the temporary address in Epic WAM.    Kerby Less   [facility] Specialty Technician

## 2022-06-14 MED ORDER — VALACYCLOVIR 1 GRAM TABLET
ORAL_TABLET | Freq: Every day | ORAL | 0 refills | 90 days | Status: CP
Start: 2022-06-14 — End: 2022-09-12
  Filled 2022-06-18: qty 30, 30d supply, fill #0

## 2022-06-14 NOTE — Unmapped (Signed)
Medication Requested: Valacyclovir 1000 mg tablet    Last visit: 10/30/2021    No future appointments.    Per Provider Note:     Standing order protocol requirements met?: Yes    Sent to: Pharmacy per protocol    Days Supply Given: 90 days  Number of Refills: 0

## 2022-06-18 MED FILL — ALENDRONATE 70 MG TABLET: ORAL | 84 days supply | Qty: 12 | Fill #1

## 2022-07-15 NOTE — Unmapped (Signed)
Joan Burns has been contacted in regards to their refill of BIKTARVY. At this time, they have declined refill due to patient having 60 doses remaining. Refill assessment call date has been updated per the patient's request.

## 2022-07-29 NOTE — Unmapped (Signed)
Abstraction Result Flowsheet Data    This patient's last AWV date: [place] Last Medicare Wellness Visit Date: Not Found  This patients last WCC/CPE date: : Not Found      Reason for Encounter  Reason for Encounter: Outreach  Primary Reason for Outreach: AWV  Text Message: No  MyChart Message: Yes  Outreach Call Outcome: Left message

## 2022-09-03 DIAGNOSIS — B20 Human immunodeficiency virus [HIV] disease: Principal | ICD-10-CM

## 2022-09-03 MED ORDER — LEVOTHYROXINE 100 MCG TABLET
ORAL_TABLET | Freq: Every day | ORAL | 0 refills | 30 days | Status: CP
Start: 2022-09-03 — End: 2022-10-03
  Filled 2022-09-04: qty 30, 30d supply, fill #0

## 2022-09-03 MED ORDER — BIKTARVY 50 MG-200 MG-25 MG TABLET
ORAL_TABLET | Freq: Every day | ORAL | 0 refills | 90 days | Status: CP
Start: 2022-09-03 — End: ?
  Filled 2022-09-04: qty 90, 90d supply, fill #0

## 2022-09-03 NOTE — Unmapped (Signed)
[facility] Clinical Assessment & Refill Coordination Note    Joan Burns, DOB: May 14, 1950  Phone: [phone] (home)     All above HIPAA information was verified with patient's family member, Saralyn Pilar (daughter).     Was a Nurse, learning disability used for this call? No    Specialty Medication(s):   Infectious Disease: Biktarvy     Current Outpatient Medications   Medication Sig Dispense Refill    alendronate (FOSAMAX) 70 MG tablet Take 1 tablet (70 mg total) by mouth every seven (7) days. 4 tablet 11    benzonatate (TESSALON) 100 MG capsule prendre un comprim?? toutes les 8 heures au besoin pour la toux 20 capsule 0    bictegrav-emtricit-tenofov ala (BIKTARVY) 50-200-25 mg tablet Take 1 tablet by mouth in the morning. 90 tablet 0    calcium carbonate-vitamin D3 600 mg-10 mcg (400 unit) per tablet Take 1 tablet by mouth daily.      cetirizine (ZYRTEC) 10 MG tablet Take 1 tablet (10 mg total) by mouth daily.      fluticasone propionate (FLONASE) 50 mcg/actuation nasal spray 1 spray into each nostril daily. 1 pulv??risation dans chaque narine par jour 16 g 0    LAGEVRIO, EUA, 200 mg capsule TAKE 4 CAPSULES BY MOUTH TWICE A DAY X 5 DAYS      levothyroxine (SYNTHROID) 100 MCG tablet Take 1 tablet (100 mcg total) by mouth daily. 30 tablet 0    polyvinyl alcohol/povidone/PF (REFRESH CLASSIC, PF, OPHT) Apply to eye.      sulfamethoxazole-trimethoprim (BACTRIM) 400-80 mg per tablet Take 1 tablet (80 mg of trimethoprim total) by mouth in the morning. 90 tablet 0    valACYclovir (VALTREX) 1000 MG tablet Take 1 tablet (1,000 mg total) by mouth daily. 90 tablet 0     No current facility-administered medications for this visit.        Changes to medications:  no changes    Allergies   Allergen Reactions    Tetanus-Diphtheria Toxoids-Td Rash       Changes to allergies: No    SPECIALTY MEDICATION ADHERENCE     Biktarvy 50-200-25 mg: unable to verify the number of  days of medicine on hand Medication Adherence    Patient reported X missed doses in the last month: 0  Specialty Medication: Biktarvy 50-200-25mg   Patient is on additional specialty medications: No  Demonstrates understanding of importance of adherence: yes  Informant: child/children  Reliability of informant: reliable  Provider-estimated medication adherence level: good  Patient is at risk for Non-Adherence: No          Specialty medication(s) dose(s) confirmed: Regimen is correct and unchanged.     Are there any concerns with adherence? No    Adherence counseling provided? Not needed    CLINICAL MANAGEMENT AND INTERVENTION      Clinical Benefit Assessment:    Do you feel the medicine is effective or helping your condition? Yes    HIV ASSOCIATED LABS:     Lab Results   Component Value Date/Time    HIVRS Not Detected 10/30/2021 02:58 PM    HIVRS Detected (A) 10/02/2021 09:13 AM    HIVRS Not Detected 04/16/2021 09:22 AM    HIVCP 55 (H) 10/02/2021 09:13 AM    HIVCP 12,564 (H) 07/20/2020 01:20 PM    ACD4 120 (L) 10/30/2021 02:58 PM    ACD4 168 (L) 10/02/2021 09:13 AM    ACD4 120 (L) 04/16/2021 09:22 AM  Clinical Benefit counseling provided? Labs from 10/30/21 show evidence of clinical benefit    Adverse Effects Assessment:    Are you experiencing any side effects? No    Are you experiencing difficulty administering your medicine? No    Quality of Life Assessment:    How many days over the past month did your HIV  keep you from your normal activities? For example, brushing your teeth or getting up in the morning. 0    Have you discussed this with your provider? Not needed    Acute Infection Status:    Acute infections noted within Epic:  No active infections  Patient reported infection: None    Therapy Appropriateness:    Is therapy appropriate based on current medication list, adverse reactions, adherence, clinical benefit and progress toward achieving therapeutic goals? Yes, therapy is appropriate and should be continued DISEASE/MEDICATION-SPECIFIC INFORMATION      N/A    HIV: Not Applicable    PATIENT SPECIFIC NEEDS     Does the patient have any physical, cognitive, or cultural barriers? No    Is the patient high risk? No    Did the patient require a clinical intervention? No    Does the patient require physician intervention or other additional services (i.e., nutrition, smoking cessation, social work)? No    SOCIAL DETERMINANTS OF HEALTH     At the [facility], we have learned that life circumstances - like trouble affording food, housing, utilities, or transportation can affect the health of many of our patients.   That is why we wanted to ask: are you currently experiencing any life circumstances that are negatively impacting your health and/or quality of life? Patient declined to answer    Social Determinants of Health     Financial Resource Strain: High Risk (03/17/2021)    Overall Financial Resource Strain (CARDIA)     Difficulty of Paying Living Expenses: Hard   Internet Connectivity: Not on file   Food Insecurity: Food Insecurity Present (03/17/2021)    Hunger Vital Sign     Worried About Running Out of Food in the Last Year: Sometimes true     Ran Out of Food in the Last Year: Sometimes true   Tobacco Use: Low Risk  (11/02/2021)    Patient History     Smoking Tobacco Use: Never     Smokeless Tobacco Use: Never     Passive Exposure: Not on file   Housing/Utilities: High Risk (03/17/2021)    Housing/Utilities     Within the past 12 months, have you ever stayed: outside, in a car, in a tent, in an overnight shelter, or temporarily in someone else's home (i.e. couch-surfing)?: Yes     Are you worried about losing your housing?: Yes     Within the past 12 months, have you been unable to get utilities (heat, electricity) when it was really needed?: Yes   Alcohol Use: Alcohol Misuse (04/14/2020)    Alcohol Use     How often do you have a drink containing alcohol?: 4+ times per week     How many drinks containing alcohol do you have on a typical day when you are drinking?: 10+     How often do you have 5 or more drinks on one occasion?: Daily or almost daily   Transportation Needs: Unmet Transportation Needs (03/17/2021)    PRAPARE - Therapist, art (Medical): Yes     Lack of Transportation (Non-Medical): Yes  Substance Use: Not on file   Health Literacy: Not on file   Physical Activity: Not on file   Interpersonal Safety: Unknown (09/03/2022)    Interpersonal Safety     Unsafe Where You Currently Live: Not on file     Physically Hurt by Anyone: Not on file     Abused by Anyone: Not on file   Stress: Not on file   Intimate Partner Violence: Not on file   Depression: Not at risk (03/28/2021)    PHQ-2     PHQ-2 Score: 0   Social Connections: Not on file       Would you be willing to receive help with any of the needs that you have identified today? Not applicable       SHIPPING     Specialty Medication(s) to be Shipped:   Infectious Disease: Biktarvy    Other medication(s) to be shipped:   Levothyroxine 100 mcg  Valacyclovir 1000mg   Alendronate 70mg      Changes to insurance: No    Delivery Scheduled: Yes, Expected medication delivery date: 09/05/22.     Medication will be delivered via UPS to the confirmed temporary address in [facility].    The patient will receive a drug information handout for each medication shipped and additional FDA Medication Guides as required.  Verified that patient has previously received a Conservation officer, historic buildings and a Surveyor, mining.    The patient or caregiver noted above participated in the development of this care plan and knows that they can request review of or adjustments to the care plan at any time.      All of the patient's questions and concerns have been addressed.    Roderic Palau, PharmD   [facility] Specialty Pharmacist

## 2022-09-04 MED FILL — VALACYCLOVIR 1 GRAM TABLET: ORAL | 30 days supply | Qty: 30 | Fill #1

## 2022-09-04 MED FILL — ALENDRONATE 70 MG TABLET: ORAL | 84 days supply | Qty: 12 | Fill #2

## 2022-11-01 NOTE — Unmapped (Signed)
Attempted to call patient for patient's re-engagement in care. Left a discreet 'appointment needed' message in voicemail box.     Sent MyChart message to patient to re-engage in care.       [facility] Care Plan: Out of Care Patient (>6 months)    [name] last ID clinic appt: 10/30/2021    Goal: Patient will have an office visit within 6 months of the last office visit unless stated otherwise by their provider.     Contact Interventions may include:  Placing phone calls to the patient and/or listed contacts.  Sending the patient an Out of Care text message through the [employer] [facility] or [facility] cell phone (if texting agreement is signed).  Sending the patient an Out of Care MyChart/letter.  Contacting the patient's most recent pharmacies as needed for additional information such as contact information or refill history.  Contacting the patient's case manager/caregiver as needed for additional information, if assigned.  Researching other online resources as needed.     If the patient is reached, the [employer] will attempt to:  Update all contact information including primary and secondary phone numbers and emergency contact details and address. Confirm access to MyChart.  Complete the Out of Care Intervention questionnaire with the patient.  Explore and discuss any barriers to visit attendance the patient may be experiencing that result in missed appointments, no-shows, cancellations, or lack of a scheduled follow-up appointment. These may include communication barriers (language, phone access, texting, MyChart), transportation, financial concerns, time off work, mental health/substance use, and/or multiple medical appointments.  Make appropriate referrals and link the patient to clinic nurses, social workers, benefits counselors, or other support services in the [facility] ID clinic to address concerns or barriers to care.  Schedule a follow-up appointment at an appropriate interval based on a completed assessment.  Confirm the patient has access to ART medications until that appointment. If the patient is not currently taking ART, needs refills, or is having difficulty taking the medication, refer to the clinical staff for follow up via [employer].    Monitor attendance of the patient's scheduled appointment and provide reminder calls/texts/messages consistent with patient preference, if needed.  Continue follow-up if indicated.    Expected Outcome:  Patient will have an office visit within 6 months of the last office visit unless stated otherwise by their provider.    If the patient has an urgent medical problem send a chat message to the clinic nursing staff for immediate follow-up     **If the patient is not able to be located or retained in HIV care, a referral will be placed to the [facility] Counselor for further follow-up.    Duration of Service: 5 minutes    Carissa Crews  Linkage and Scientist, research (medical)

## 2022-11-27 DIAGNOSIS — B20 Human immunodeficiency virus [HIV] disease: Principal | ICD-10-CM

## 2022-11-27 MED ORDER — LEVOTHYROXINE 100 MCG TABLET
ORAL_TABLET | Freq: Every day | ORAL | 0 refills | 30 days
Start: 2022-11-27 — End: 2022-12-27

## 2022-11-27 MED ORDER — BIKTARVY 50 MG-200 MG-25 MG TABLET
ORAL_TABLET | Freq: Every day | ORAL | 0 refills | 90 days | Status: CP
Start: 2022-11-27 — End: ?
  Filled 2022-12-03: qty 90, 90d supply, fill #0

## 2022-11-27 NOTE — Unmapped (Signed)
[facility] Refill Coordination Note    Specialty Medication(s) to be Shipped:   Infectious Disease: Biktarvy    Other medication(s) to be shipped:  alendronate and valacyclovir and  Levothyroxine       Joan Burns, DOB: 11/14/1950  Phone: [phone] (home)       All above HIPAA information was verified with patient's family member, daughter.     Was a Nurse, learning disability used for this call? No    Completed refill call assessment today to schedule patient's medication shipment from the [facility] [phone]).  All relevant notes have been reviewed.     Specialty medication(s) and dose(s) confirmed: Regimen is correct and unchanged.   Changes to medications: Suvi reports no changes at this time.  Changes to insurance: No  New side effects reported not previously addressed with a pharmacist or physician: None reported  Questions for the pharmacist: No    Confirmed patient received a Conservation officer, historic buildings and a Surveyor, mining with first shipment. The patient will receive a drug information handout for each medication shipped and additional FDA Medication Guides as required.       DISEASE/MEDICATION-SPECIFIC INFORMATION        N/A    SPECIALTY MEDICATION ADHERENCE     Medication Adherence    Patient reported X missed doses in the last month: 0  Specialty Medication: BIKTARVY 50-200-25 mg tablet (bictegrav-emtricit-tenofov ala)  Patient is on additional specialty medications: No  Patient is on more than two specialty medications: No  Any gaps in refill history greater than 2 weeks in the last 3 months: no  Demonstrates understanding of importance of adherence: yes                Were doses missed due to medication being on hold? No    BIKTARVY 50-200-25   mg:  days of medicine on hand       REFERRAL TO PHARMACIST     Referral to the pharmacist: Not needed      [facility]     Shipping address confirmed in Epic.       Delivery Scheduled: Yes, Expected medication delivery date: 12/04/22.  However, Rx request for refills was sent to the provider as there are none remaining.     Medication will be delivered via UPS to the temporary address in Epic WAM.    Joan Burns   [facility] Specialty Technician

## 2022-11-27 NOTE — Unmapped (Signed)
Contacted patient's daughter [name], who coordinates patient's care and listed as patient's primary number in chart. Informed her that patient has not been seen now for a year and needs follow up. She agrees to make appointment for patient on 11/14 at 1:30pm. No further questions or concerns at this time.

## 2022-11-27 NOTE — Unmapped (Signed)
Medication Requested: Biktarvy       No future appointments.  Per Provider Note: Overall doing well. Current regimen: Biktarvy (BIC/FTC/TAF)     Standing order protocol requirements met?: No    Sent to: Provider for signing    Days Supply Given: TBD by provider  Number of Refills: TBD by provider

## 2022-12-03 MED FILL — VALACYCLOVIR 1 GRAM TABLET: ORAL | 30 days supply | Qty: 30 | Fill #2

## 2022-12-03 MED FILL — ALENDRONATE 70 MG TABLET: ORAL | 84 days supply | Qty: 12 | Fill #3

## 2022-12-11 NOTE — Unmapped (Signed)
Patients daughter came into the clinic to drop off a medical certification disability form for immigration for the provider to fill out. She attached a guide to the back of the form incase the provider needed to see how the form needs to be filled out. She requested a callback when form is ready for pickup or if the provider has any question. Will leave form on nurses desk. Please advise.

## 2022-12-12 NOTE — Unmapped (Signed)
Form placed on providers desk on 12/10/22.  Dyke Brackett LPN

## 2022-12-26 ENCOUNTER — Ambulatory Visit: Admit: 2022-12-26 | Discharge: 2022-12-27 | Payer: BLUE CROSS/BLUE SHIELD | Attending: Family | Primary: Family

## 2022-12-26 ENCOUNTER — Ambulatory Visit: Admit: 2022-12-26 | Discharge: 2022-12-27 | Payer: BLUE CROSS/BLUE SHIELD

## 2022-12-26 DIAGNOSIS — Z113 Encounter for screening for infections with a predominantly sexual mode of transmission: Principal | ICD-10-CM

## 2022-12-26 DIAGNOSIS — Z79899 Other long term (current) drug therapy: Principal | ICD-10-CM

## 2022-12-26 DIAGNOSIS — Z9189 Other specified personal risk factors, not elsewhere classified: Principal | ICD-10-CM

## 2022-12-26 DIAGNOSIS — R0602 Shortness of breath: Principal | ICD-10-CM

## 2022-12-26 DIAGNOSIS — Z5181 Encounter for therapeutic drug level monitoring: Principal | ICD-10-CM

## 2022-12-26 DIAGNOSIS — B20 Human immunodeficiency virus [HIV] disease: Principal | ICD-10-CM

## 2022-12-26 DIAGNOSIS — Z8619 Personal history of other infectious and parasitic diseases: Principal | ICD-10-CM

## 2022-12-26 DIAGNOSIS — N6312 Unspecified lump in the right breast, upper inner quadrant: Principal | ICD-10-CM

## 2022-12-26 MED ORDER — BIKTARVY 50 MG-200 MG-25 MG TABLET
ORAL_TABLET | Freq: Every day | ORAL | 3 refills | 90 days | Status: CP
Start: 2022-12-26 — End: ?
  Filled 2023-02-24: qty 90, 90d supply, fill #0

## 2022-12-26 NOTE — Unmapped (Signed)
INFECTIOUS DISEASES CLINIC  [address]  [place], [place]  [address]  P [phone]  F [phone]     Primary care provider: Amie Portland, FNP    Assessment/Plan:      HIV (dx'd 2015, nadir CD4 76/4% in 07/20/20)  - chronic, severe  Patient diagnosed with HIV in 2015 as part of pre-op work up for myomectomy. Per report from daughter, patient had declined treatment and her daughters through the years had tried to get her into care but was unable to, both here in [place] and in [place]. They were unable to establish care for her in [place] because her Medicaid was in [place].    Patient speaks Tshiluba or Luba-Kasai, a [place] language, that can be accessed by [employer] (via telephone [phone]) if an appointment is made with them. Patient actually speaks a combination of Swahili and Tshiluba but prefers interpretation in Tshiluba/Luba-Kasai. [place] interpreter can be used if Tsiluba/Luba-kasai interpreter cannot be found. Does not prefer Swahili.    [place] interpreter used which was patient's preference. Patient is unaccompanied.    Has been out of care, last seen 10/2021. Patient has spent 8 months in [place], got back to [place] in 05/2022.    Overall doing well. Current regimen: Biktarvy (BIC/FTC/TAF)  Misses doses of ARVs never     Med access via Medicaid  CD4 count  102/6% after starting Biktarvy , on Bactrim SS daily. Repeat CD4 today. Will stop Bactrim if continues to be virally suppressed.  Discussed ARV adherence and taking ARVs with food    Lab Results   Component Value Date    ACD4 120 (L) 10/30/2021    CD4 8 (L) 10/30/2021    HIVRS Not Detected 10/30/2021    HIVCP 55 (H) 10/02/2021     CD4, HIV RNA, and safety labs (full return panel) - patient unable to get labs drawn, Medicaid transportation had already arrived. Return to clinic in one week.  Patient has been virally suppressed since 08/2020 and would have been able to stop Bactrim in 03/2021, despite having CD4 count less than 200. However, was lost to follow up and labs checked in 09/2021 with viral load at 55. Was undetected in 10/2021 but was lost to follow up again. Re-establishing care today.  Continue current therapy On Biktarvy and Bactrim SS, tolerating well. Refills provided. Some question as to how patient was able to have 8 months supply of Biktarvy, she reports not having a doctor in [place].  Discussed importance of ARV adherence      Chest pain (none currently) - Shortness of breath - new  See HPI.  Right breast mass incidentally found on physical exam. (See below)  Obtain chest x-ray  Walking pulse ox WNL  Needs to go to the ED if chest pain occurs at any time.      Right breast mass, incidentally found - new finding to me  Patient had not reported breast mass at previous visits though according to patient and daughter this was a mass present for some years.  Obtain chest x-ray - unremarkable results today  Obtain diagnostic bilateral mammogram with ultrasound if needed.  [name] agrees (via telephone with patient) to have [place] make appointment for as soon as possible.       Malaria infection (by patient report) (12/2021)  Infected and treated while in [place].  Had joint pain with infection, now resolved.      HIV retinopathy  -  chronic  Diagnosed by Ophthalmology, Dr. Larry Sierras  Incidental finding of CWS when patient being evaluated for cataracts.  Daughter reports that patient has not complained of vision changes prior to this diagnosis.  Continue close follow up with Ophthalmology, next appointment due 06/2021 but has not followed up      +HepC Ab/HCV RNA not detected (03/2020)  Consider baseline ultrasound.   Confirm no treatment for Hep C  Check Hep C RNA      Right knee pain - chronic, exacerbated  Patient has chronic right knee complaints, evaluated by ED for it on 09/25/21 (advised to see PCP about this) and Dr. Rosine Beat with [facility] on 10/02/21.   [place] PVL ordered for patient and has appointment 10/31/21.  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  Right knee today is slightly swollen and tender somewhat to touch though no signs of infection. Has pain with opposing forces to RLE.  At last visit with Dr. Rosine Beat, imaging ordered with possibility for knee injection.  On 11/02/21, seen for knee pain. Given orders for PT and IBU. Suspected autoimmune disorder.   Patient continues to have bilateral knee pain, unable to address with competing priorities.  Needs to schedule PT.      HSV-2 infection/Perianal lesions  -  resolved  Reports that lesion started by itching, which became a vesicle and then because of itching, the lesions opened. Watery, clear discharge has been draining since then, leasions seems to be getting bigger. Not experiencing pain, but is itchy.   07/11/20 Evaluated by PCP with surface swab revealing mixed gram positive organisms. Started on Bactrim SS BID.  Dr. Matt Holmes in to evaluate patient along with myself. Skin erosions are characteristic of HSV infection that may also be superinfected with bacteria.  Patient given Valtrex 1000mg  daily for HSV suppression.       History of CoVid infection (dx'd 09/25/21) - acute, self-limited  Evaluated at [facility] with the following symptoms: congestion, sinus pressure, nonproductive cough, and some chills.   Prescribed molnupiravir and tessalon perles- took the medications      Uterine leiomyoma - chronic  Had uterine surgery in 2014 per patient report  Seen by GYN 12/27/20 and no further evaluation needed at this time other than routine well woman exam.       Hypothyroidism - chronic, stable  Managed by PCP      Alcohol Abuse - chronic - not addressed today  Drinks 5 beers or wine a day  Since being on antibiotics, patient drinks 1-2 drinks daily.      Reported history of Bell's Palsy and Shingles outbreak      Sexual health & secondary prevention  -  abstinent  Not in relationship. Last sexual encounter >5 years ago, remains abstinent  She  is abstinent  She does not routinely discuss HIV status with partner(s).  Has children and post menopausal .    Lab Results   Component Value Date    RPR Nonreactive 07/20/2020    RPR Nonreactive 06/29/2020     GC/CT NAATs - not being checked routinely for this patient  RPR - for screening obtained today      Health maintenance  - chronic, acutely exacerbated  PCP: Ali Lowe, FNP/ Dr. Rosine Beat    Oral health  She  unknown  have a dentist. Last dental exam unknown.    Eye health  She does  use corrective lenses. Last eye exam followed by Opthalmology.    Metabolic conditions  Wt Readings from Last 5 Encounters:   12/26/22 78.9 kg (174 lb)   11/02/21 71.7 kg (158 lb)   10/30/21 71.7 kg (158 lb)   10/18/21 70.2 kg (154 lb 11.2 oz)   10/02/21 69.2 kg (152 lb 9.6 oz)     Lab Results   Component Value Date    CREATININE 0.58 (L) 10/30/2021    GLUCOSEU Negative 07/20/2020    GLU 121 (H) 10/30/2021    ALT 11 10/30/2021    ALT 18 10/02/2021    ALT 14 03/28/2021     # Kidney health - creatinine, UA, and urine albumin:creatinine ratio today - need to obtain at next visit  # Bone health - defer mgm't to PCP, on Fosamax  # Diabetes assessment - random glucose today  # NAFLD assessment - monitor over time    Communicable diseases  Lab Results   Component Value Date    QFTTBGOLD Negative 06/29/2020    HEPAIGG Reactive (A) 07/20/2020    HEPBSAB Reactive (A) 07/20/2020    HEPCAB Reactive (A) 04/14/2020    HCVRNA Not Detected 04/14/2020     # TB screening - no longer needed; negative IGRA, low risk  # Hepatitis screening -  as noted:  See above, Hep A/B IMMUNE  # MMR screening - not assessed    Cancer screening  No results found for: PSASCRN, PSA, PAP, FINALDX  # Cervical -  Defer management to GYN  # Breast - mammogram ordered today due to breast mass    # Anorectal -  will disucss  # Colorectal -  Defer to PCP  # Liver - no screening indicated  # Lung - screening not indicated    Cardiovascular disease  Lab Results   Component Value Date    CHOL 172 03/28/2021    HDL 48 03/28/2021    LDL 99 03/28/2021    NONHDL 124 03/28/2021    TRIG 126 03/28/2021     # The 10-year ASCVD risk score (Arnett DK, et al., 2019) is: 9.4%  - is not taking aspirin   - is not taking statin  - BP control good  - never smoker  # AAA screening - no indication for screening    Immunization History   Administered Date(s) Administered    COVID-19 VACCINE,MRNA(MODERNA)(PF) 10/06/2020, 11/03/2020    Influenza Vaccine Quad(IM)6 MO-Adult(PF) 10/30/2021     Immunizations- needs PCV-20  Patient received primary CoVid series and reports getting subsequent CoVid boosters through her work place.- patient currently not working    I personally spent 60 minutes face-to-face and non-face-to-face in the care of this patient, which includes all pre, intra, and post visit time on the date of service.   Time for translation extended out our discussions.      Disposition  Next appointment: 4 weeks - 1 week for lab testing and ensure mammogram is done      To do @ next RTC  Check on mammogram scheduling  Discuss anal pap.  Alcohol history  RPR  Consider abdominal ultrasound      Varney Daily, FNP-BC  [facility]  [address], [place], [place] [address]    Phone: [phone]   Fax: [phone]             Subjective      Chief Complaint   HIV follow up  HPI  In addition to details in A&P above:    Daughter lives in [place] at this time and will travel back and forth occasionally to help with health care for patient. Patient has 3 daughters but [name] is her primary caregiver out of her daughters.  Denies any fever, chills, nausea, vomiting, rash, urinary complaints, diarrhea  Feeling good overall, denies any side effects from medications  Has knee pain as above.    12/26/22  Was in [place] for 8 months - had supply of medications  Had malaria while she was there, had malaria treatment while she was there. Occurred about 6 months before she returned to the [place]. Came back in May 2024.  Denies any fever, chills, nausea, vomiting, rash, urinary complaints, diarrhea, constipation.  Has put on 14 pounds since last visit.  Pain in chest, difficulty breathing she thinks from gaining weight. Sharp poking pain midsternally, noise with breathing. Happens at rest when sleeping, laying down, wakes her up, lasts about 10-15 minutes, drinks water and then restarts, can't sleep well. Happens every 3rd week. Last happened last Friday. Started 2 months ago.  Bilateral knees and back hurts.  [name] is having a lot of health problems right now. She is trying to see if her sister in [place] can help her mom but for now she is the main contact for care of her mother. Patient agrees.      Past Medical History:   Diagnosis Date    Herpes simplex infection of perianal skin     HIV disease (CMS-HCC)     Other osteoporosis without current pathological fracture     Other specified hypothyroidism        Social History  Background - From [place]. Works at [employer] at this time. Currently lives in [place], [place].    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 All others negative.      Medications and Allergies  She has a current medication list which includes the following prescription(s): alendronate, benzonatate, biktarvy, calcium carbonate-vitamin d3, cetirizine, fluticasone propionate, lagevrio (eua), levothyroxine, polyvinyl alcohol/povidone/pf, sulfamethoxazole-trimethoprim, and valacyclovir.    Allergies: Tetanus-diphtheria toxoids-td      Family History  Her family history is not on file.          Objective:      BP 129/75 (BP Site: L Arm, BP Position: Sitting, BP Cuff Size: Large)  - Pulse 85  - Temp 36.6 ??C (97.9 ??F) (Temporal)  - Ht 149.9 cm (4' 11)  - Wt 78.9 kg (174 lb)  - SpO2 98%  - BMI 35.14 kg/m??   Wt Readings from Last 3 Encounters:   12/26/22 78.9 kg (174 lb)   11/02/21 71.7 kg (158 lb)   10/30/21 71.7 kg (158 lb)       Const looks well and attentive, alert, appropriate   Eyes sclerae anicteric, noninjected OU   ENT no thrush, leukoplakia or oral lesions   Lymph no cervical or supraclavicular LAD   CV RRR. No murmurs. No rub or gallop. S1/S2.   Lungs CTAB ant/post, normal work  of breathing   GI Soft, no organomegaly. NTND. NABS.   GU deferred   Rectal deferred   Skin no petechiae, ecchymoses or obvious rashes on clothed exam   MSK normal ROM throughout and bilateral knees have pain with certain movements to opposition. Strength 5/5 in left knee but 4/5 in R knee due to pain with opposing force.   Neuro Grossly intact   Psych Appropriate affect. Eye contact good. Linear thoughts. Fluent speech.     Physical Exam  Chest:      Chest wall: Mass present.             Laboratory Data  Reviewed in Epic today, using Synopsis and Chart Review filters.    Lab Results   Component Value Date    CREATININE 0.58 (L) 10/30/2021    QFTTBGOLD Negative 06/29/2020    HEPCAB Reactive (A) 04/14/2020    HCVRNA Not Detected 04/14/2020    CHOL 172 03/28/2021    HDL 48 03/28/2021    LDL 99 03/28/2021    NONHDL 124 03/28/2021    TRIG 126 03/28/2021

## 2022-12-27 NOTE — Unmapped (Signed)
COVID Education:  Make sure you perform good hand washing (lasting 20 seconds), continue to social distance and limit close personal contact (which may include new sexual partners or having multiple partners during this period).  Try to isolate at home but please find ways to keep in touch with those close to you, such as meeting up with them electronically or socially distanced, and the ability to go outdoors alone or separated from others  If you become ill with fever, respiratory illness, sudden loss of taste and smell, stomach issues, diarrhea, nausea, vomiting - contact clinic for further instructions.  You should go to the emergency department if you develop systems such as shortness of breath, confusion, lightheadedness when standing, high fever.   Here is some information about HIV and CoVid vaccines: [email or web address]  If you're interested in receiving the CoVid vaccine when you're eligible, here are some resources for you to check and make an appointment:  Your local health department   [email or web address] through [facility]  [email or web address]  [facility] (if you are an established patient with them)  [email or web address]    URGENT CARE  Please call ahead to speak with the nursing staff if you are in need of an urgent appointment.       MEDICATIONS  For refills please contact your pharmacy and ask them to electronically send or fax the request to the clinic.   Please bring all medications in original bottles to every appointment.    HMAP (formerly ADAP) or [employer] Eligibility (required even if you do not receive medication through [facility])  Please remember to renew your Juanell Fairly eligibility during renewal periods which occur twice a year: January-March and July-September.     The following are needed for each renewal:   - [facility] Identification (if you don't have one, then a bill with your name and address in [place])   - proof of income (award letter, W-2, or last three check stubs)   If you are unable to come in for renewal, let us know if we can mail, fax or e-mail paperwork to you.   HMAP Contact: [phone].     Lab info:  Your most recent CD4 T-cell counts and viral loads are below. Here are a few things to keep in mind when looking at your numbers:  Our goal is to get your virus to be undetectable and keep it undetectable. If the virus is undetectable you are much more likely to stay healthy.  We consider your viral load to be undetectable if it says <40 or if it says Not detected.  For most people, we're checking CD4 counts every other visit (once or twice a year, or sometimes even less).  It's normal for your CD4 count to be different from visit to visit.   You can help by taking your medications at about the same time, every single day. If you're having trouble with taking your medications, it's important to let us know.    Lab Results   Component Value Date    ACD4 120 (L) 10/30/2021    CD4 8 (L) 10/30/2021    HIVCP 55 (H) 10/02/2021    HIVRS Not Detected 10/30/2021        Please note that your laboratory and other results may be visible to you in real time, possibly before they reach your provider. Please allow 48 hours for clinical interpretation of these results. Importantly, even if a result is flagged as abnormal, it may not be one  that impacts your health.    It was nice to have a visit with you today!  Follow-up information:        Provider today:  Varney Daily, FNP-BC      ID CLINIC address:   [facility]  [address]  [place], [place] [address]    Contact information:    The ID clinic phone number is [phone]   The ID clinic fax number is [phone]  For urgent issues on nights and weekends: Call the ID Physician on-call through the [facility] Operator at [phone].    Please sign up for My [place] Chart - This is a great way to review your labs and track your appointments    Please try to arrive 30 minutes BEFORE your scheduled appointment time!  This will give you time to fill out any front desk paperwork needed for your visit, and allow you to be seen as close to your scheduled appointment time as possible.

## 2022-12-27 NOTE — Unmapped (Signed)
Name: Joan Burns  Date: 12/27/2022  Address: [address]  [place] [address]   [place] of Residence:  [place]  Phone: [phone]     Started assessment with patient options: phone call    Is this the same address for mailing? Yes  If no, Mailing Address is:     What is your preferred method of contact? Phone Call    Is there anyone that you would want to add as your personal contact? No; if yes, please use SmartPhrase RWPersonalContactInfo to gather their contacts information.    N/A    Housing Status  Stable/Permanent; if so, what is their housing type: Renting and living - room, house, or apartment    Insurance  Medicaid    Marital Status  Single    Tax Press photographer Status  I did not file taxes     Employment Status  Medically Unable to Work    Income  No Household Income/Deductions of any kind    If no or low income, how are you meeting your basic needs?  Family Support    List Tax Household Members including relationship to you:   N/A    Someone in my household receives: Not Applicable (for household members)  Specify who: N/A    Do you or anyone in your home have income adjustments? No    If yes, which adjustments do you have? N/A      Medication Access/Barriers: none, insured    Do you have a current diagnosis for Hepatitis C?  Lab Results   Component Value Date    HEPCAB Reactive (A) 04/14/2020    HCVRNA Not Detected 04/14/2020       Federal Marketplace Eligibility Assessment  Patient has affordable insurance through [employer], [place], and or Employment and is not eligible.    Patient given ACA education if they qualified based on answers to questions above.     MyChart  Do you have an active MyChart account? Yes     If MyChart is not set up, informed patient on how to set up MyChart N/A    Patient was informed of the following programs;   N/A    The following applications/handouts were given to patient:   N/A    The following forms were also started with the patient: N/A    Medicaid:  N/A      Ryan White/HMAP application status: Complete    Patient is applying for [employer] on Charges Only     Additional Comments: Spoke with patient's daughter.            Mickle Asper,  Benefits & Eligibility Coordinator  Time of Intervention: 7 minutes

## 2022-12-28 NOTE — Unmapped (Signed)
 Patient completed [employer] application. Eligible for RW B&C grant services and Caps on Charges. IPL= 0%, FPL= 0%. Expires 01/21/2024    RW Eligibility Form informing patient about RW services and Caps on charges was sent to patient via MyChart Message        Mickle Asper,  Benefits & Eligibility Coordinator  Time of Intervention: 2 minutes

## 2023-01-01 NOTE — Unmapped (Signed)
Forms dropped off per front desk.   Disability forms needing to be completed has appt scheduled for 01/21/23 needs forms by 01/27/23. Original forms scanned into media.

## 2023-01-02 ENCOUNTER — Ambulatory Visit: Admit: 2023-01-02 | Discharge: 2023-01-03 | Payer: BLUE CROSS/BLUE SHIELD | Attending: Family | Primary: Family

## 2023-01-02 DIAGNOSIS — B20 Human immunodeficiency virus [HIV] disease: Principal | ICD-10-CM

## 2023-01-02 DIAGNOSIS — N6312 Unspecified lump in the right breast, upper inner quadrant: Principal | ICD-10-CM

## 2023-01-02 DIAGNOSIS — Z113 Encounter for screening for infections with a predominantly sexual mode of transmission: Principal | ICD-10-CM

## 2023-01-02 DIAGNOSIS — Z8619 Personal history of other infectious and parasitic diseases: Principal | ICD-10-CM

## 2023-01-02 DIAGNOSIS — Z9189 Other specified personal risk factors, not elsewhere classified: Principal | ICD-10-CM

## 2023-01-02 DIAGNOSIS — Z5181 Encounter for therapeutic drug level monitoring: Principal | ICD-10-CM

## 2023-01-02 DIAGNOSIS — Z79899 Other long term (current) drug therapy: Principal | ICD-10-CM

## 2023-01-02 LAB — CBC W/ AUTO DIFF
BASOPHILS ABSOLUTE COUNT: 0.1 10*9/L (ref 0.0–0.1)
BASOPHILS ABSOLUTE COUNT: 0.1 10*9/L (ref 0.0–0.1)
BASOPHILS RELATIVE PERCENT: 1.1 %
BASOPHILS RELATIVE PERCENT: 1.9 %
EOSINOPHILS ABSOLUTE COUNT: 0.3 10*9/L (ref 0.0–0.5)
EOSINOPHILS ABSOLUTE COUNT: 0.3 10*9/L (ref 0.0–0.5)
EOSINOPHILS RELATIVE PERCENT: 5.2 %
EOSINOPHILS RELATIVE PERCENT: 5.8 %
HEMATOCRIT: 28.3 % — ABNORMAL LOW (ref 34.0–44.0)
HEMATOCRIT: 28.4 % — ABNORMAL LOW (ref 34.0–44.0)
HEMOGLOBIN: 9.2 g/dL — ABNORMAL LOW (ref 11.3–14.9)
HEMOGLOBIN: 9.2 g/dL — ABNORMAL LOW (ref 11.3–14.9)
LYMPHOCYTES ABSOLUTE COUNT: 1.6 10*9/L (ref 1.1–3.6)
LYMPHOCYTES ABSOLUTE COUNT: 1.8 10*9/L (ref 1.1–3.6)
LYMPHOCYTES RELATIVE PERCENT: 30.3 %
LYMPHOCYTES RELATIVE PERCENT: 34.2 %
MEAN CORPUSCULAR HEMOGLOBIN CONC: 32.3 g/dL (ref 32.0–36.0)
MEAN CORPUSCULAR HEMOGLOBIN CONC: 32.5 g/dL (ref 32.0–36.0)
MEAN CORPUSCULAR HEMOGLOBIN: 22.1 pg — ABNORMAL LOW (ref 25.9–32.4)
MEAN CORPUSCULAR HEMOGLOBIN: 22.3 pg — ABNORMAL LOW (ref 25.9–32.4)
MEAN CORPUSCULAR VOLUME: 68.3 fL — ABNORMAL LOW (ref 77.6–95.7)
MEAN CORPUSCULAR VOLUME: 68.5 fL — ABNORMAL LOW (ref 77.6–95.7)
MEAN PLATELET VOLUME: 8.9 fL (ref 6.8–10.7)
MEAN PLATELET VOLUME: 8.9 fL (ref 6.8–10.7)
MONOCYTES ABSOLUTE COUNT: 0.4 10*9/L (ref 0.3–0.8)
MONOCYTES ABSOLUTE COUNT: 0.5 10*9/L (ref 0.3–0.8)
MONOCYTES RELATIVE PERCENT: 7.2 %
MONOCYTES RELATIVE PERCENT: 9.5 %
NEUTROPHILS ABSOLUTE COUNT: 2.7 10*9/L (ref 1.8–7.8)
NEUTROPHILS ABSOLUTE COUNT: 2.7 10*9/L (ref 1.8–7.8)
NEUTROPHILS RELATIVE PERCENT: 51.5 %
NEUTROPHILS RELATIVE PERCENT: 53.3 %
PLATELET COUNT: 282 10*9/L (ref 150–450)
PLATELET COUNT: 282 10*9/L (ref 150–450)
RED BLOOD CELL COUNT: 4.13 10*12/L (ref 3.95–5.13)
RED BLOOD CELL COUNT: 4.15 10*12/L (ref 3.95–5.13)
RED CELL DISTRIBUTION WIDTH: 17.5 % — ABNORMAL HIGH (ref 12.2–15.2)
RED CELL DISTRIBUTION WIDTH: 17.6 % — ABNORMAL HIGH (ref 12.2–15.2)
WBC ADJUSTED: 5.1 10*9/L (ref 3.6–11.2)
WBC ADJUSTED: 5.2 10*9/L (ref 3.6–11.2)

## 2023-01-02 LAB — URINALYSIS WITH MICROSCOPY WITH CULTURE REFLEX PERFORMABLE
BILIRUBIN UA: NEGATIVE
BLOOD UA: NEGATIVE
HYALINE CASTS: 1 /LPF (ref 0–1)
KETONES UA: NEGATIVE
NITRITE UA: NEGATIVE
PH UA: 6 (ref 5.0–9.0)
RBC UA: 3 /HPF (ref <=4)
RENAL TUBULAR EPITHELIAL CELLS: <1 /HPF — ABNORMAL HIGH (ref <=0)
SPECIFIC GRAVITY UA: 1.026 (ref 1.003–1.030)
SQUAMOUS EPITHELIAL: 4 /HPF (ref 0–5)
UROBILINOGEN UA: <2
WBC UA: 12 /HPF — ABNORMAL HIGH (ref 0–5)

## 2023-01-02 LAB — BASIC METABOLIC PANEL
ANION GAP: 9 mmol/L (ref 5–14)
BLOOD UREA NITROGEN: 8 mg/dL — ABNORMAL LOW (ref 9–23)
BUN / CREAT RATIO: 12
CALCIUM: 9 mg/dL (ref 8.7–10.4)
CHLORIDE: 103 mmol/L (ref 98–107)
CO2: 26.6 mmol/L (ref 20.0–31.0)
CREATININE: 0.65 mg/dL (ref 0.55–1.02)
EGFR CKD-EPI (2021) FEMALE: >90 mL/min/{1.73_m2} (ref >=60)
GLUCOSE RANDOM: 184 mg/dL — ABNORMAL HIGH (ref 70–179)
POTASSIUM: 4 mmol/L (ref 3.4–4.8)
SODIUM: 139 mmol/L (ref 135–145)

## 2023-01-02 LAB — BILIRUBIN, TOTAL: BILIRUBIN TOTAL: 0.4 mg/dL (ref 0.3–1.2)

## 2023-01-02 LAB — LYMPH MARKER LIMITED,FLOW
ABSOLUTE CD3 CNT: 1296 {cells}/uL (ref 915–3400)
ABSOLUTE CD4 CNT: 128 {cells}/uL — ABNORMAL LOW (ref 510–2320)
ABSOLUTE CD8 CNT: 1120 {cells}/uL (ref 180–1520)
CD3% (T CELLS): 81 % (ref 61–86)
CD4% (T HELPER): 8 % — ABNORMAL LOW (ref 34–58)
CD4:CD8 RATIO: 0.1 — ABNORMAL LOW (ref 0.9–4.8)
CD8% T SUPPRESR: 70 % — ABNORMAL HIGH (ref 12–38)

## 2023-01-02 LAB — AST: AST (SGOT): 15 U/L (ref <=34)

## 2023-01-02 LAB — ALT: ALT (SGPT): 11 U/L (ref 10–49)

## 2023-01-02 LAB — ALBUMIN / CREATININE URINE RATIO
ALBUMIN QUANT URINE: 0.5 mg/dL
ALBUMIN/CREATININE RATIO: 2 ug/mg (ref 0.0–30.0)
CREATININE, URINE: 244.2 mg/dL

## 2023-01-02 NOTE — Unmapped (Signed)
INFECTIOUS DISEASES CLINIC  [address]  [place], [place]  [address]  P [phone]  F [phone]     Primary care provider: Amie Portland, FNP    Assessment/Plan:      [name] primary contact is still Joan Burns (daughter). She schedules patient's Medicaid transport and manages her medical care. This may change down the road but [name] and patient firm that she is still main contact.    HIV (dx'd 2015, nadir CD4 76/4% in 07/20/20)  - chronic, severe  Patient diagnosed with HIV in 2015 as part of pre-op work up for myomectomy. Per report from daughter, patient had declined treatment and her daughters through the years had tried to get her into care but was unable to, both here in [place] and in [place]. They were unable to establish care for her in [place] because her Medicaid was in .    Patient speaks Tshiluba or Luba-Kasai, a [place] language, that can be accessed by [employer] (via telephone [phone]) if an appointment is made with them. Patient actually speaks a combination of Swahili and Tshiluba but prefers interpretation in Tshiluba/Luba-Kasai. [place] interpreter can be used if Tsiluba/Luba-kasai interpreter cannot be found. Does not prefer Swahili.    [place] interpreter used which was patient's preference. Patient is unaccompanied.    Has been out of care, last seen 10/2021. Patient has spent 8 months in [place], got back to [place] in 05/2022.    Overall doing well. Current regimen: Biktarvy (BIC/FTC/TAF)  Misses doses of ARVs never     Med access via Medicaid  CD4 count  102/6% after starting Biktarvy , on Bactrim SS daily. Repeat CD4 today. Will stop Bactrim if continues to be virally suppressed.  Discussed ARV adherence and taking ARVs with food    Lab Results   Component Value Date    ACD4 128 (L) 01/02/2023    CD4 8 (L) 01/02/2023    HIVRS Not Detected 10/30/2021    HIVCP 55 (H) 10/02/2021     CD4, HIV RNA, and safety labs (full return panel)   Patient has been virally suppressed since 08/2020 and would have been able to stop Bactrim in 03/2021, despite having CD4 count less than 200. However, was lost to follow up and labs checked in 09/2021 with viral load at 55. Was undetected in 10/2021 but was lost to follow up again. Re-establishing care today. Patient tolerating well, CD4% 8%, frequently out of care, will keep patient on Bactrim SS until patient is in care more regularly.  Continue current therapy On Biktarvy and Bactrim SS, tolerating well. Refills provided. Some question as to how patient was able to have 8 months supply of Biktarvy, she reports not having a doctor in [place].  Discussed importance of ARV adherence      Chest pain (none currently) - Shortness of breath - new  See HPI.  Right breast mass incidentally found on physical exam. (See below)  Obtain chest x-ray - unremarkable  Walking pulse ox WNL  Needs to go to the ED if chest pain occurs at any time.      Right breast mass, incidentally found - new finding to me  Patient had not reported breast mass at previous visits though according to patient and daughter this was a mass present for some years. Patient reports size is stable. No LAD on exam.  Obtain chest x-ray - unremarkable results today  Obtain diagnostic bilateral mammogram with ultrasound if needed. - Appointment made for 02/11/23,  soonest available. [name] (daughter) is planning to come to [place] so that she can accompany patient to this visit.      Malaria infection (by patient report) (12/2021)  Infected and treated while in [place].  Reports having malaria treatment x 3 months with oral medication. Followed up with doctor in [place] who said she was cured.  Had joint pain with infection, now resolved.  No symptoms since treatment.  Discussed with Dr. Charlestine Massed who does not recommend any medications at this time but if she becomes symptomatic - malaise, fatigue, fevers, chills, should order [id number] - Malaria exam, peripheral blood.      HIV retinopathy  -  chronic    Diagnosed by Ophthalmology, Dr. Larry Sierras  Incidental finding of CWS when patient being evaluated for cataracts.  Daughter reports that patient has not complained of vision changes prior to this diagnosis.  Continue close follow up with Ophthalmology, next appointment due 06/2021 but has not followed up - still needs to schedule      +HepC Ab/HCV RNA not detected (03/2020)  Consider baseline ultrasound.   Confirm no treatment for Hep C  Check Hep C RNA today      Right knee pain - chronic, exacerbated - did not address at this visit  Patient has chronic right knee complaints, evaluated by ED for it on 09/25/21 (advised to see PCP about this) and Dr. Rosine Beat with [facility] on 10/02/21.   [place] PVL ordered for patient and has appointment 10/31/21.  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  Right knee today is slightly swollen and tender somewhat to touch though no signs of infection. Has pain with opposing forces to RLE.  At last visit with Dr. Rosine Beat, imaging ordered with possibility for knee injection.  On 11/02/21, seen for knee pain. Given orders for PT and IBU. Suspected autoimmune disorder.   Patient continues to have bilateral knee pain, unable to address with competing priorities.  Needs to schedule PT.      HSV-2 infection/Perianal lesions  -  resolved  Reports that lesion started by itching, which became a vesicle and then because of itching, the lesions opened. Watery, clear discharge has been draining since then, leasions seems to be getting bigger. Not experiencing pain, but is itchy.   07/11/20 Evaluated by PCP with surface swab revealing mixed gram positive organisms. Started on Bactrim SS BID.  Dr. Matt Holmes in to evaluate patient along with myself. Skin erosions are characteristic of HSV infection that may also be superinfected with bacteria.  Patient given Valtrex 1000mg  daily for HSV suppression. Refills x 1 year      History of CoVid infection (dx'd 09/25/21) - treated  Evaluated at [facility] with the following symptoms: congestion, sinus pressure, nonproductive cough, and some chills.   Prescribed molnupiravir and tessalon perles- took the medications      Uterine leiomyoma - chronic  Had uterine surgery in 2014 per patient report  Seen by GYN 12/27/20 and no further evaluation needed at this time other than routine well woman exam.       Hypothyroidism - chronic, stable  Managed by PCP      Alcohol use disorder - chronic - not addressed today  Drinks 5 beers or wine a day  Since being on antibiotics, patient drinks 1-2 drinks daily.      Reported history of Bell's Palsy and  Shingles outbreak      Sexual health & secondary prevention  -  abstinent  Not in relationship. Last sexual encounter >5 years ago, remains abstinent  She  is abstinent  She does not routinely discuss HIV status with partner(s).  Has children and post menopausal .    Lab Results   Component Value Date    RPR Nonreactive 01/02/2023    RPR Nonreactive 07/20/2020     GC/CT NAATs - not being checked routinely for this patient  RPR - for screening obtained today      Health maintenance  - chronic, acutely exacerbated  PCP: Ali Lowe, FNP/ Dr. Rosine Beat    Oral health  She  unknown  have a dentist. Last dental exam unknown.    Eye health  She does  use corrective lenses. Last eye exam followed by Opthalmology.    Metabolic conditions  Wt Readings from Last 5 Encounters:   01/02/23 80.1 kg (176 lb 9.6 oz)   12/26/22 78.9 kg (174 lb)   11/02/21 71.7 kg (158 lb)   10/30/21 71.7 kg (158 lb)   10/18/21 70.2 kg (154 lb 11.2 oz)     Lab Results   Component Value Date    CREATININE 0.65 01/02/2023    GLUCOSEU Trace (A) 01/02/2023    ALBCRERAT 2.0 01/02/2023    GLU 184 (H) 01/02/2023    ALT 11 01/02/2023    ALT 11 10/30/2021    ALT 18 10/02/2021     # Kidney health - creatinine, UA, and urine albumin:creatinine ratio today   # Bone health - defer mgm't to PCP, on Fosamax  # Diabetes assessment - random glucose today  # NAFLD assessment - monitor over time    Communicable diseases  Lab Results   Component Value Date    QFTTBGOLD Negative 06/29/2020    HEPAIGG Reactive (A) 07/20/2020    HEPBSAB Reactive (A) 07/20/2020    HEPCAB Reactive (A) 04/14/2020    HCVRNA Not Detected 04/14/2020     # TB screening - no longer needed; negative IGRA, low risk  # Hepatitis screening -  as noted:  See above, Hep A/B IMMUNE  # MMR screening - not assessed    Cancer screening  No results found for: PSASCRN, PSA, PAP, FINALDX  # Cervical -  Defer management to GYN  # Breast - mammogram ordered today due to breast mass    # Anorectal -  will disucss  # Colorectal -  Defer to PCP  # Liver - no screening indicated  # Lung - screening not indicated    Cardiovascular disease  Lab Results   Component Value Date    CHOL 172 03/28/2021    HDL 48 03/28/2021    LDL 99 03/28/2021    NONHDL 124 03/28/2021    TRIG 126 03/28/2021     # The 10-year ASCVD risk score (Arnett DK, et al., 2019) is: 11.6%  - is not taking aspirin   - is not taking statin  - BP control good  - never smoker  # AAA screening - no indication for screening    Immunization History   Administered Date(s) Administered    COVID-19 VACCINE,MRNA(MODERNA)(PF) 10/06/2020, 11/03/2020    Influenza Vaccine Quad(IM)6 MO-Adult(PF) 10/30/2021     Immunizations- needs PCV-20  Patient received primary CoVid series and reports getting subsequent CoVid boosters through her work place.- patient currently not working    I personally spent 35 minutes face-to-face and non-face-to-face in the  care of this patient, which includes all pre, intra, and post visit time on the date of service.   Time for translation extended out our discussions.      Disposition  Next appointment: 3 months       To do @ next RTC  Discuss anal pap.  Alcohol history  Consider abdominal ultrasound  PCV-20      Varney Daily, FNP-BC  [facility] at [facility]  [address] [place], [place] [address]    Phone: [phone]   Fax: [phone]             Subjective      Chief Complaint   HIV follow up    HPI  In addition to details in A&P above:    Daughter lives in [place] at this time and will travel back and forth occasionally to help with health care for patient. Patient has 3 daughters but Joan Burns is her primary caregiver out of her daughters.  Denies any fever, chills, nausea, vomiting, rash, urinary complaints, diarrhea  Feeling good overall, denies any side effects from medications  Has knee pain as above.    12/26/22  Was in [place] for 8 months - had supply of medications  Had malaria while she was there, had malaria treatment while she was there. Occurred about 6 months before she returned to the [place]. Came back in May 2024.  Denies any fever, chills, nausea, vomiting, rash, urinary complaints, diarrhea, constipation.  Has put on 14 pounds since last visit.  Pain in chest, difficulty breathing she thinks from gaining weight. Sharp poking pain midsternally, noise with breathing. Happens at rest when sleeping, laying down, wakes her up, lasts about 10-15 minutes, drinks water and then restarts, can't sleep well. Happens every 3rd week. Last happened last Friday. Started 2 months ago.  Bilateral knees and back hurts.  [name] is having a lot of health problems right now. She is trying to see if her sister in [place] can help her mom but for now she is the main contact for care of her mother. Patient agrees.      Past Medical History:   Diagnosis Date    Herpes simplex infection of perianal skin     HIV disease (CMS-HCC)     Other osteoporosis without current pathological fracture     Other specified hypothyroidism        Social History  Background - From [place]. Works at [employer] at this time. Currently lives in [place], [place].    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 All others negative.      Medications and Allergies  She has a current medication list which includes the following prescription(s): alendronate, benzonatate, biktarvy, calcium carbonate-vitamin d3, cetirizine, lagevrio (eua), polyvinyl alcohol/povidone/pf, sulfamethoxazole-trimethoprim, fluticasone propionate, and levothyroxine.    Allergies: Tetanus-diphtheria toxoids-td      Family History  Her family history is not on file.          Objective:      BP 144/79 (BP Site: R Arm, BP Position: Sitting)  -  Pulse 85  - Temp 35.7 ??C (96.3 ??F) (Temporal)  - Ht 149.9 cm (4' 11)  - Wt 80.1 kg (176 lb 9.6 oz)  - BMI 35.67 kg/m??   Wt Readings from Last 3 Encounters:   01/02/23 80.1 kg (176 lb 9.6 oz)   12/26/22 78.9 kg (174 lb)   11/02/21 71.7 kg (158 lb)       Const looks well and attentive, alert, appropriate   Eyes sclerae anicteric, noninjected OU   Psych Appropriate affect. Eye contact good. Linear thoughts. Fluent speech.       Laboratory Data  Reviewed in Epic today, using Synopsis and Chart Review filters.    Lab Results   Component Value Date    CREATININE 0.65 01/02/2023    QFTTBGOLD Negative 06/29/2020    HEPCAB Reactive (A) 04/14/2020    HCVRNA Not Detected 04/14/2020    CHOL 172 03/28/2021    HDL 48 03/28/2021    LDL 99 03/28/2021    NONHDL 124 03/28/2021    TRIG 126 03/28/2021

## 2023-01-02 NOTE — Unmapped (Signed)
Joan Burns has been contacted in regards to their refill of BIKTARVY. At this time, they have declined refill due to patient having 60 doses remaining. Refill assessment call date has been updated per the patient's request.

## 2023-01-03 LAB — SYPHILIS SCREEN: SYPHILIS RPR SCREEN: NONREACTIVE

## 2023-01-03 LAB — HIV RNA, QUANTITATIVE, PCR: HIV RNA QNT RSLT: NOT DETECTED

## 2023-01-03 LAB — HEPATITIS C RNA, QUANTITATIVE, PCR: HCV RNA: NOT DETECTED

## 2023-01-04 NOTE — Unmapped (Signed)
COVID Education:  Make sure you perform good hand washing (lasting 20 seconds), continue to social distance and limit close personal contact (which may include new sexual partners or having multiple partners during this period).  Try to isolate at home but please find ways to keep in touch with those close to you, such as meeting up with them electronically or socially distanced, and the ability to go outdoors alone or separated from others  If you become ill with fever, respiratory illness, sudden loss of taste and smell, stomach issues, diarrhea, nausea, vomiting - contact clinic for further instructions.  You should go to the emergency department if you develop systems such as shortness of breath, confusion, lightheadedness when standing, high fever.   Here is some information about HIV and CoVid vaccines: [email or web address]  If you're interested in receiving the CoVid vaccine when you're eligible, here are some resources for you to check and make an appointment:  Your local health department   [email or web address] through [facility]  [email or web address]  [facility] (if you are an established patient with them)  [email or web address]    URGENT CARE  Please call ahead to speak with the nursing staff if you are in need of an urgent appointment.       MEDICATIONS  For refills please contact your pharmacy and ask them to electronically send or fax the request to the clinic.   Please bring all medications in original bottles to every appointment.    [facility]MAP (formerly ADAP) or [employer] Eligibility (required even if you do not receive medication through [facility])  Please remember to renew your Juanell Fairly eligibility during renewal periods which occur twice a year: January-March and July-September.     The following are needed for each renewal:   - [facility] Identification (if you don't have one, then a bill with your name and address in [place])   - proof of income (award letter, W-2, or last three check stubs)   If you are unable to come in for renewal, let us know if we can mail, fax or e-mail paperwork to you.   HMAP Contact: [phone].     Lab info:  Your most recent CD4 T-cell counts and viral loads are below. Here are a few things to keep in mind when looking at your numbers:  Our goal is to get your virus to be undetectable and keep it undetectable. If the virus is undetectable you are much more likely to stay healthy.  We consider your viral load to be undetectable if it says <40 or if it says Not detected.  For most people, we're checking CD4 counts every other visit (once or twice a year, or sometimes even less).  It's normal for your CD4 count to be different from visit to visit.   You can help by taking your medications at about the same time, every single day. If you're having trouble with taking your medications, it's important to let us know.    Lab Results   Component Value Date    ACD4 128 (L) 01/02/2023    CD4 8 (L) 01/02/2023    HIVCP 55 (H) 10/02/2021    HIVRS Not Detected 10/30/2021        Please note that your laboratory and other results may be visible to you in real time, possibly before they reach your provider. Please allow 48 hours for clinical interpretation of these results. Importantly, even if a result is flagged as abnormal, it may not be one  that impacts your health.    It was nice to have a visit with you today!  Follow-up information:        Provider today:  Varney Daily, FNP-BC      ID CLINIC address:   [facility] at [facility]  [address]  [place], [place] [address]    Contact information:    The ID clinic phone number is [phone]   The ID clinic fax number is [phone]  For urgent issues on nights and weekends: Call the ID Physician on-call through the [facility] Operator at [phone].    Please sign up for My [place] Chart - This is a great way to review your labs and track your appointments    Please try to arrive 30 minutes BEFORE your scheduled appointment time!  This will give you time to fill out any front desk paperwork needed for your visit, and allow you to be seen as close to your scheduled appointment time as possible.

## 2023-01-17 NOTE — Unmapped (Signed)
Immigration Forms reviewed per TC, NP. Per provider recommendations, forms are unable to be completed due to no previous established diagnosis of anxiety, per patients requested rational for exemption.     Patient is scheduled for annual in office exam on 01/21/2023 forms due on 01/26/2022. I called and informed patients daughter of providers review and advise, understanding verbalized with no further questions or concerns at this time. Daughter states she would like patient to still be seen as scheduled and will be in to pick up forms.     Forms placed upfront for pick up

## 2023-01-21 ENCOUNTER — Ambulatory Visit: Admit: 2023-01-21 | Discharge: 2023-01-22 | Payer: BLUE CROSS/BLUE SHIELD

## 2023-01-21 DIAGNOSIS — E785 Hyperlipidemia, unspecified: Principal | ICD-10-CM

## 2023-01-21 DIAGNOSIS — E039 Hypothyroidism, unspecified: Principal | ICD-10-CM

## 2023-01-21 DIAGNOSIS — F4310 Post-traumatic stress disorder, unspecified: Principal | ICD-10-CM

## 2023-01-21 DIAGNOSIS — Z21 Asymptomatic human immunodeficiency virus [HIV] infection status: Principal | ICD-10-CM

## 2023-01-21 DIAGNOSIS — D649 Anemia, unspecified: Principal | ICD-10-CM

## 2023-01-21 DIAGNOSIS — M810 Age-related osteoporosis without current pathological fracture: Principal | ICD-10-CM

## 2023-01-21 DIAGNOSIS — F1010 Alcohol abuse, uncomplicated: Principal | ICD-10-CM

## 2023-01-21 DIAGNOSIS — F419 Anxiety disorder, unspecified: Principal | ICD-10-CM

## 2023-01-21 DIAGNOSIS — F32A Depression, unspecified depression type: Principal | ICD-10-CM

## 2023-01-21 LAB — LIPID PANEL
CHOLESTEROL/HDL RATIO SCREEN: 4.2 (ref 1.0–4.5)
CHOLESTEROL: 208 mg/dL — ABNORMAL HIGH (ref <=200)
HDL CHOLESTEROL: 50 mg/dL (ref 40–60)
LDL CHOLESTEROL CALCULATED: 133 mg/dL — ABNORMAL HIGH (ref 40–99)
NON-HDL CHOLESTEROL: 158 mg/dL — ABNORMAL HIGH (ref 70–130)
TRIGLYCERIDES: 127 mg/dL (ref 0–150)
VLDL CHOLESTEROL CAL: 25.4 mg/dL (ref 11–41)

## 2023-01-21 LAB — T4, FREE: FREE T4: 1.05 ng/dL (ref 0.89–1.76)

## 2023-01-21 LAB — TSH: THYROID STIMULATING HORMONE: 5.562 u[IU]/mL — ABNORMAL HIGH (ref 0.550–4.780)

## 2023-01-21 NOTE — Unmapped (Signed)
Levothyroxine
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
## 2023-01-23 MED ORDER — LEVOTHYROXINE 100 MCG TABLET
ORAL_TABLET | Freq: Every day | ORAL | 0 refills | 30.00 days
Start: 2023-01-23 — End: 2023-02-22

## 2023-01-23 NOTE — Unmapped (Signed)
Copied from CRM #[id number]. Topic: Access To Clinicians - Medication Refill  >> Jan 23, 2023  1:03 PM Godfrey Pick wrote:  The caller reports that they have previously requested refill from pharmacy, but have not received authorization yet.     The patients daughter, [name], is requesting the following:     Medication(s) for refill: levothyroxine (SYNTHROID) 100 MCG tablet [[id number]]     Quantity for 1-3 month supply    Pharmacy name and address: [facility]  [address] [place] [address]  Phone: [phone]  Fax: [phone]        Please contact [name] by Cell Phone in regards to this request.    Coverage has been verified and updated if needed: yes    Routine callback turnaround time: 24-48 business hours. Programmer, systems Notified)

## 2023-01-23 NOTE — Unmapped (Signed)
Please call one of the dtr's with lab results - try to actually speak with one of them as there is a medication change - They speak English - Pt does not     Good afternoon Joan Burns     Your TSH is elevated so we need to adjust your levothyroxine dosage   As your dtr did not call back, I do not know what actual dose of levothyroxine you are on - Your chart looks like you should be out of the current prescription you had and I need to know if you are taking the levothyroxine and what dose you are taking prior to me adjusting the dose - Can you please verify this for me     Your cholesterol is elevated - Please try to lower you intake of carbs/sugar/saturated fats, exercise regularly, drink more water, and decrease your alcohol intake   We will recheck at next visit     Otherwise, your labs look good     Rosey Bath

## 2023-01-23 NOTE — Unmapped (Signed)
Abstraction Result Flowsheet Data    This patient's last AWV date: [facility] Last Medicare Wellness Visit Date: Not Found  This patients last WCC/CPE date: : Not Found      Reason for Encounter  Reason for Encounter: Outreach  Primary Reason for Outreach: [employer] Follow Up  Text Message: No  MyChart Message: Yes

## 2023-01-23 NOTE — Unmapped (Addendum)
Refill requested and pending, patients daughter called and verified that medication list and dosing is up to date with current list given to her most recent visit with her AVS.

## 2023-01-23 NOTE — Unmapped (Signed)
Patient is requesting the following refill  Requested Prescriptions     Pending Prescriptions Disp Refills    levothyroxine (SYNTHROID) 100 MCG tablet 30 tablet 0     Sig: Take 1 tablet (100 mcg total) by mouth daily.       Recent Visits  Date Type Provider Dept   01/21/23 Office Visit Coralee North, Magnus Ivan, FNP [facility] [address] [facility]   Showing recent visits within past 365 days and meeting all other requirements  Future Appointments  Date Type Provider Dept   03/12/23 Appointment Coralee North, Magnus Ivan, FNP [facility] [address] [facility]   Showing future appointments within next 365 days and meeting all other requirements       Labs: TSH:   TSH (uIU/mL)   Date Value   01/21/2023 5.562 (H)     [facility]

## 2023-01-24 MED ORDER — LEVOTHYROXINE 125 MCG TABLET
ORAL_TABLET | Freq: Every day | ORAL | 3 refills | 90.00 days | Status: CP
Start: 2023-01-24 — End: 2024-01-24

## 2023-01-24 NOTE — Unmapped (Signed)
Patients son in law Daryel November came into clinic to pick up patients immigration forms. He stated the patient advised him that it was needed before Monday 01/27/23. Fransico Michael I would send provider a message to check on status of form being completed and someone from the office will call when they are ready for pickup. His callback number is [phone]. Please advise.

## 2023-01-24 NOTE — Unmapped (Signed)
Per TC, NP a valid form of state issues identification for completion of forms is the patient is needed in order to complete form. I contacted Joan Burns and informed him of what is needed for completion of forms, understanding verbalized no further questions at this time.  returned

## 2023-01-27 MED ORDER — LEVOTHYROXINE 125 MCG TABLET
ORAL_TABLET | Freq: Every day | ORAL | 0 refills | 90.00 days | Status: CP
Start: 2023-01-27 — End: 2024-01-27
  Filled 2023-02-24: qty 90, 90d supply, fill #0

## 2023-01-27 NOTE — Unmapped (Signed)
I called and spoke with patients daughter [name] and informed her that increased medication had been sent into confirmed pharmacy on file. Patients daughter states that she is currently sick and would like all medication to go to mail order pharmacy. Medication pending

## 2023-01-29 NOTE — Unmapped (Signed)
Please notify pt of lab result note by phone and if they do not answer mail them a lab result letter   Thanks   Rosey Bath

## 2023-01-29 NOTE — Unmapped (Signed)
Message sent via text message for patient to schedule virtual appointment with Social Worker for Brief Therapy.    Abstraction Result Flowsheet Data    This patient's last AWV date: [facility] Last Medicare Wellness Visit Date: Not Found  This patients last WCC/CPE date: : Not Found      Reason for Encounter  Reason for Encounter: Outreach  Primary Reason for Outreach: [employer] Follow Up  Text Message: Yes  MyChart Message: No  Outreach Call Outcome: Message Sent Only (Sent Text message with link)

## 2023-01-29 NOTE — Unmapped (Signed)
Message sent via text message for patient to schedule virtual appointment with Social Worker for Brief Therapy .

## 2023-02-03 NOTE — Unmapped (Signed)
Abstraction Result Flowsheet Data    This patient's last AWV date: [facility] Last Medicare Wellness Visit Date: Not Found  This patients last WCC/CPE date: : Not Found      Reason for Encounter  Reason for Encounter: Outreach  Primary Reason for Outreach: [employer] Follow Up  Text Message: No  MyChart Message: No  Outreach Call Outcome: Patient Would Like to Call Back Later

## 2023-02-03 NOTE — Unmapped (Signed)
 Outreached to patient to schedule Behavioral Health Visit.  Patient was unable to be contacted. Left Message.

## 2023-02-11 ENCOUNTER — Inpatient Hospital Stay: Admit: 2023-02-11 | Discharge: 2023-02-12 | Payer: BLUE CROSS/BLUE SHIELD

## 2023-02-20 DIAGNOSIS — M810 Age-related osteoporosis without current pathological fracture: Principal | ICD-10-CM

## 2023-02-20 MED ORDER — ALENDRONATE 70 MG TABLET
ORAL_TABLET | ORAL | 1 refills | 84.00 days | Status: CP
Start: 2023-02-20 — End: 2024-02-20
  Filled 2023-02-24: qty 12, 84d supply, fill #0

## 2023-02-20 NOTE — Unmapped (Signed)
[facility] Refill Coordination Note    Specialty Medication(s) to be Shipped:   Infectious Disease: Biktarvy    Other medication(s) to be shipped: alendronate 70 MG tablet , (FOSAMAX)levothyroxine 125 MCG tablet (SYNTHROID)     Joan Burns, DOB: 29-Dec-1950  Phone: [phone] (home)       All above HIPAA information was verified with patient's family member, [name].     Was a Nurse, learning disability used for this call? No    Completed refill call assessment today to schedule patient's medication shipment from the [facility]  [phone]).  All relevant notes have been reviewed.     Specialty medication(s) and dose(s) confirmed: Regimen is correct and unchanged.   Changes to medications: [name] reports no changes at this time.  Changes to insurance: No  New side effects reported not previously addressed with a pharmacist or physician: None reported  Questions for the pharmacist: No    Confirmed patient received a Conservation officer, historic buildings and a Surveyor, mining with first shipment. The patient will receive a drug information handout for each medication shipped and additional FDA Medication Guides as required.       DISEASE/MEDICATION-SPECIFIC INFORMATION        N/A    SPECIALTY MEDICATION ADHERENCE     Medication Adherence    Patient reported X missed doses in the last month: 0  Specialty Medication: BIKTARVY 50-200-25 mg tablet (bictegrav-emtricit-tenofov ala)  Patient is on additional specialty medications: No  Patient is on more than two specialty medications: No  Any gaps in refill history greater than 2 weeks in the last 3 months: no  Demonstrates understanding of importance of adherence: yes              Were doses missed due to medication being on hold? No    BIKTARVY 50-200-25  mg: 7 days of medicine on hand       REFERRAL TO PHARMACIST     Referral to the pharmacist: Not needed      [facility]     Shipping address confirmed in Epic.       Delivery Scheduled: Yes, Expected medication delivery date: 02/25/23.     Medication will be delivered via UPS to the temporary address in Epic WAM.    Moshe Salisbury   [facility]  Specialty Technician

## 2023-02-21 NOTE — Unmapped (Signed)
Staff contacted Interpretation Line at [phone] and requested a [place] interpreter.  Staff had interpreter leave a message for patient to call back about her scheduled new behavioral health video visit today.    Patient did not show for today's behavioral health appointment. LCSW called patient in hopes of discussing this missed appointment and to reschedule, and patient did not answer.  Treatment is now discontinued until patient returns call.

## 2023-02-27 NOTE — Unmapped (Signed)
Phone outreach to patient completed. Patient scheduled virtual visit with Child psychotherapist for Brief Therapy .    Abstraction Result Flowsheet Data    This patient's last AWV date: [facility] Last Medicare Wellness Visit Date: Not Found  This patients last WCC/CPE date: : Not Found      Reason for Encounter  Reason for Encounter: Outreach  Primary Reason for Outreach: [employer] Follow Up  Text Message: No  MyChart Message: Yes  Outreach Call Outcome: Scheduled Video

## 2023-03-02 ENCOUNTER — Other Ambulatory Visit: Payer: Self-pay

## 2023-03-02 ENCOUNTER — Encounter (HOSPITAL_COMMUNITY): Payer: Self-pay

## 2023-03-02 ENCOUNTER — Emergency Department (HOSPITAL_COMMUNITY)
Admission: EM | Admit: 2023-03-02 | Discharge: 2023-03-02 | Disposition: A | Discharge status: Home / Self Care | Payer: Medicaid Other | Attending: Emergency Medicine | Admitting: Emergency Medicine

## 2023-03-02 DIAGNOSIS — Z20822 Contact with and (suspected) exposure to covid-19: Secondary | ICD-10-CM | POA: Diagnosis not present

## 2023-03-02 DIAGNOSIS — N6312 Unspecified lump in the right breast, upper inner quadrant: Secondary | ICD-10-CM | POA: Insufficient documentation

## 2023-03-02 DIAGNOSIS — J101 Influenza due to other identified influenza virus with other respiratory manifestations: Secondary | ICD-10-CM | POA: Diagnosis not present

## 2023-03-02 DIAGNOSIS — R059 Cough, unspecified: Secondary | ICD-10-CM | POA: Diagnosis present

## 2023-03-02 DIAGNOSIS — K644 Residual hemorrhoidal skin tags: Secondary | ICD-10-CM | POA: Insufficient documentation

## 2023-03-02 DIAGNOSIS — K649 Unspecified hemorrhoids: Secondary | ICD-10-CM

## 2023-03-02 HISTORY — DX: Disorder of thyroid, unspecified: E07.9

## 2023-03-02 HISTORY — DX: Essential (primary) hypertension: I10

## 2023-03-02 HISTORY — DX: Asymptomatic human immunodeficiency virus (hiv) infection status: Z21

## 2023-03-02 LAB — RESP PANEL BY RT-PCR (RSV, FLU A&B, COVID)  RVPGX2
Influenza A by PCR: POSITIVE — AB
Influenza B by PCR: NEGATIVE
Resp Syncytial Virus by PCR: NEGATIVE
SARS Coronavirus 2 by RT PCR: NEGATIVE

## 2023-03-02 MED ORDER — HYDROCORTISONE ACETATE 25 MG RE SUPP: 25.0000 mg | Freq: Two times a day (BID) | RECTAL | 0 refills | Status: AC

## 2023-03-02 MED ORDER — ACETAMINOPHEN 500 MG PO TABS
1000.0000 mg | ORAL_TABLET | Freq: Once | ORAL | Status: AC
  Administered 2023-03-02: 1000 mg via ORAL
  Filled 2023-03-02: qty 2

## 2023-03-02 MED ORDER — HYDROCORTISONE (PERIANAL) 2.5 % EX CREA: 1.0000 | TOPICAL_CREAM | Freq: Two times a day (BID) | CUTANEOUS | 0 refills | Status: AC

## 2023-03-02 NOTE — ED Provider Notes (Addendum)
 [place] EMERGENCY DEPARTMENT AT [facility] Provider Note   CSN: [id number] Arrival date & time: 03/02/23  1251     History  Chief Complaint  Patient presents with   Fever   Cough    Brenda Shah is a 73 y.o. female.  73 year old female presents today for concern of multiple complaints.  She complains of cough, headache which has been ongoing for the past 4 days.  She also complains of concern for abscess around her anus which has been present for the past 4 days as well.  She also complains of lump to her right breast which has been present for at least a couple weeks.  Denies pain to her breast.  No fever.  No drainage.  Has not taken any medications prior to arrival.  Does not have a PCP.   The history is provided by the patient. A language interpreter was used.       Home Medications Prior to Admission medications   Medication Sig Start Date End Date Taking? Authorizing Provider  benzonatate  (TESSALON ) 100 MG capsule Take 1 capsule (100 mg total) by mouth every 8 (eight) hours. 09/25/21   Renae Bernarda HERO, PA-C  cetirizine  (ZYRTEC  ALLERGY) 10 MG tablet Take 1 tablet (10 mg total) by mouth daily. 09/25/21   Cockerham, Alicia M, PA-C      Allergies    Patient has no allergy information on record.    Review of Systems   Review of Systems  Constitutional:  Negative for chills and fever.  HENT:  Positive for congestion.   Respiratory:  Positive for cough. Negative for shortness of breath.   Gastrointestinal:  Positive for rectal pain. Negative for abdominal pain, nausea and vomiting.  Neurological:  Negative for light-headedness.  All other systems reviewed and are negative.   Physical Exam Updated Vital Signs BP (!) 166/102 (BP Location: Right Arm)   Pulse (!) 120   Temp 99.6 F (37.6 C) (Oral)   Resp (!) 24   Ht 5' 2 (1.575 m)   Wt 63.5 kg   SpO2 98%   BMI 25.61 kg/m  Physical Exam Vitals and nursing note reviewed.  Constitutional:       General: She is not in acute distress.    Appearance: Normal appearance. She is not ill-appearing.  HENT:     Head: Normocephalic and atraumatic.     Nose: Nose normal.  Eyes:     Conjunctiva/sclera: Conjunctivae normal.  Cardiovascular:     Rate and Rhythm: Normal rate and regular rhythm.     Comments: Initially tachycardic but normal rate on my exam. Pulmonary:     Effort: Pulmonary effort is normal. No respiratory distress.     Breath sounds: Normal breath sounds. No wheezing.  Chest:     Comments: Lump noted to the right breast at the 2 o'clock position.  Appears deep.  Not consistent with an abscess. Abdominal:     General: There is no distension.     Palpations: Abdomen is soft.     Tenderness: There is no abdominal tenderness.  Genitourinary:    Comments: TJ nurse present as chaperone.  Rectal exam shows visible external hemorrhoids.  No evidence of thrombosis.  Musculoskeletal:        General: No deformity.  Skin:    Findings: No rash.  Neurological:     Mental Status: She is alert.     ED Results / Procedures / Treatments   Labs (all labs ordered are  listed, but only abnormal results are displayed) Labs Reviewed  RESP PANEL BY RT-PCR (RSV, FLU A&B, COVID)  RVPGX2 - Abnormal; Notable for the following components:      Result Value   Influenza A by PCR POSITIVE (*)    All other components within normal limits    EKG None  Radiology No results found.  Procedures Procedures    Medications Ordered in ED Medications - No data to display  ED Course/ Medical Decision Making/ A&P                                 Medical Decision Making Risk OTC drugs. Prescription drug management.   73 year old female presents today for concern of above-mentioned complaints.  She is without acute distress.  Overall well-appearing.  Initially noted to be tachycardic but normal rate on my exam.  Rectal exam consistent with external hemorrhoids.  Anusol  prescribed.   Symptomatic management discussed for influenza A.  She is positive.  Breast clinic referral given for the breast lump.  Strict return precaution discussed.  Patient voices understanding and is in agreement with plan.. Out of the window for Tamiflu.   Final Clinical Impression(s) / ED Diagnoses Final diagnoses:  Influenza A  Mass of upper inner quadrant of right breast  Hemorrhoids, unspecified hemorrhoid type    Rx / DC Orders ED Discharge Orders          Ordered    Ambulatory referral to High Risk Breast Clinic        03/02/23 1603    hydrocortisone  (ANUSOL -HC) 2.5 % rectal cream  2 times daily        03/02/23 1603    hydrocortisone  (ANUSOL -HC) 25 MG suppository  2 times daily        03/02/23 1603              Hildegard Loge, PA-C 03/02/23 1626    Hildegard Loge, PA-C 03/02/23 1626    Patt Alm Macho, MD 03/04/23 [phone]

## 2023-03-02 NOTE — ED Provider Triage Note (Signed)
 Emergency Medicine Provider Triage Evaluation Note  Brenda Shah , a 73 y.o. female  was evaluated in triage.  Pt complains of URI and abscess?.  Review of Systems  Positive:  Negative:   Physical Exam  There were no vitals taken for this visit. Gen:   Awake, no distress   Resp:  Normal effort  MSK:   Moves extremities without difficulty  Other:    Medical Decision Making  Medically screening exam initiated at 1:02 PM.  Appropriate orders placed.  Brenda Shah was informed that the remainder of the evaluation will be completed by another provider, this initial triage assessment does not replace that evaluation, and the importance of remaining in the ED until their evaluation is complete.  Fever, chills, cough x2 days. Also mentioning abscess on bottom that she has needed ABX for in the past.   Hoy Nidia FALCON, [place] 03/02/23 1303

## 2023-03-02 NOTE — ED Triage Notes (Signed)
 Pt BIB EMS from home with fever, chills, and cough x 2 days. Pt also has an abscess on her bottom that she had abts for previously.

## 2023-03-02 NOTE — Discharge Instructions (Addendum)
 You have influenza.  This explains your headache, fever, body aches.  Take Tylenol  1000 mg every 8 hours, ibuprofen 600 mg every 6-8 hours.  you also have hemorrhoids.  I have sent 2 medications and for the hemorrhoids.  1 is a cream and 1 is a suppository.  Of also given a referral to gastroenterologist.  Please also do sitz bath's.  Information regarding this listed above.  If any concerning symptoms return to the emergency room. For the breast lump I have given a referral to the breast clinic.  They should call to schedule this appointment however you can also reach out to schedule this appointment if you do not hear from them.

## 2023-03-02 NOTE — ED Notes (Signed)
 Pt will need interpreter [facility])

## 2023-03-12 NOTE — Unmapped (Signed)
 Phone outreach to patient completed. Patient scheduled virtual visit with Child psychotherapist for Brief Therapy .      Abstraction Result Flowsheet Data    This patient's last AWV date: [facility] Last Medicare Wellness Visit Date: Not Found  This patients last WCC/CPE date: : Not Found      Reason for Encounter  Reason for Encounter: Outreach  Primary Reason for Outreach: [employer] Follow Up  Text Message: Yes  MyChart Message: No  Outreach Call Outcome: Scheduled Telephone

## 2023-03-12 NOTE — Unmapped (Signed)
 Staff contacted Interpretation Line at [phone] and had interpreter leave a message in [place] for patient about today's scheduled behavioral health phone visit.  Patient did not show for today's behavioral health appointment. LCSW called patient in hopes of discussing this missed appointment and to reschedule, and patient did not answer.  Treatment is now discontinued until patient returns call.

## 2023-03-18 ENCOUNTER — Ambulatory Visit
Admit: 2023-03-18 | Discharge: 2023-03-19 | Payer: BLUE CROSS/BLUE SHIELD | Attending: Mental Health | Primary: Mental Health

## 2023-03-18 DIAGNOSIS — F419 Anxiety disorder, unspecified: Principal | ICD-10-CM

## 2023-03-18 DIAGNOSIS — F32A Depression, unspecified depression type: Principal | ICD-10-CM

## 2023-03-18 DIAGNOSIS — F4310 Post-traumatic stress disorder, unspecified: Principal | ICD-10-CM

## 2023-03-18 NOTE — Unmapped (Signed)
 Suicide & Crisis Hotlines       34 Suicide and Crisis Lifeline  Call or text 72  Online Chat: www.988lifeline.org    The Hopeline   Phone: 431-668-9982    Online chat: www.hopeline.com    [place]   Phone: 1-800-SUICIDE (432-420-6901)    National Suicide Prevention Lifeline    Phone: 4-696-295-MWUX (517-193-7130)   Online chat: www.suicidepreventionlifeline.org    Online chat (ages 31-24): https://gibson.com/        If you are experiencing a psychiatric emergency, you can call 911 or go to your nearest emergency room. Many counties have a specialized crisis center where you can walk in for a crisis assessment and referrals to additional services. Appointments are not needed, and they are open 24 hours a day, 7 days a week    24/7 Crisis Centers      [facility] (Adult patients only)  [address] [place] [address]  [phone]  Sunday - Saturday - 24 hours/day    [facility] (Adult and Pediatric patients)  [address] [place] [address]  [phone]  Sunday - Saturday - 24 hours/day    How to Find Mental Health Treatment   If you have health insurance, look on your insurance card for the phone number or website to find a list of mental health providers who accept your insurance.      If you have Medicaid, Medicare, or no insurance, see below for your county's MCO. Call the phone number to make an appointment or get more information.    If you have Medicaid, you can also get extra help finding services from the [facility] Office. Call them at [phone] from 8 a.m. to 5 p.m., Monday through Friday, except State holidays.      Mobile Crisis   Crisis situations are often best resolved at home. Mobile Crisis Teams are available 24 hours a day in all counties. Professional counselors will speak with you and your family during a visit. They have an average response time of 2 hours.       [facility]  Counties Served: [place], [place], [place], [place], [place], [place], [place], [place], [employer], [place], [place], Engineer, maintenance (IT), [place], [place], [place], [place], [place], [place], [place], [place], [place], [place], [place], [place], [place], [place], [place], [place], [place], [place], [place], [place], [place], [place], [place], [place], [place], [place], [place], [place], [place], [place], [place], [place], Melida Quitter   Crisis Line: [phone]  Website: www.trilliumhealthresources.org    24/7/365 Mobile Crisis    Behavioral Health Crisis Line: [phone]  ([place], [place], [place], [place], [place], [place], [place], [place], [place], [place], [place], Engineer, maintenance (IT), [place], [place], [place], [place], [place], [place], [place], [place], [place], [place], [place], [place], [place], [place], [place], [place], [place], [place], [place], [place], [place], [place], [place], [place], [place], [place], [place], [place], [place], [place], [place], [place], [place], [place])    [place] [employer] Crisis: [phone]  [place], [place], Neva Seat, Webb Sil[place])    [employer]ated [employer] Crisis: [phone]  [place], [place], [place], [place], [place], [place], [place], [place], [place], [place], [place], [place], [place], [place], [place], [place], [place], [place]on[place], [place], [place], [place], [place], [place], [place], [place], Hop[place], [place], [place], [place], [place], [place], [place], [place], [place], Har[place], [place], [place], [place], [place], [place], [place], [place], [place], [place], [place], [place])    [place] Crisis: [phone]  Gordy Levan, [place])    [facility] Mobile Crisis [place]: [phone]  [place], [place], [place], Yetta Barre, [place], [place], [place] and [employer])      [place] Resources:  [facility]  [address] [place], [place]   Phone: [phone]  Hours: M-F 8am-3pm    [place]---DREAM  [address] [place], [place]  Phone: [phone]  Hours: M-F Mayra Reel - CALL [phone]    [facility]  [address] [place], [place]  Phone: [phone]  Hours: M-W 8am to 5pm, Th: 8am to 8pm, F: 8am to [address] County---RHA  [address] [place], [place]  Phone: [phone]   Hours: M-F 8am-5pm    [place]--Helping Hand of [employer] [address] [place], [place]   Phone: [phone]  Hours: M-F 8-5    [facility]  [address] [place], [place]  Phone: [phone]  Hours: M-F, 8am to 3pm    [facility] [place]--[facility] [employer]  [address] Maxie Better, [place], [place]  Phone: [phone]  Hours: [place] to [place] - CALL [phone]    [place]---[facility]HA  [address] [place], [place]  Phone: [phone] / 24/7 Mobile Crisis: [phone]  Hours: M-F 8am-5pm    Rosezena Sensor [phone]    [facility]  [address] [place], [place]  Phone: [phone] / 24/7 Mobile Crisis: [phone]  Hours: M-F 8am-3pm    [place]---PORT  [address] [place], [place]   Phone: [phone]  Hours: M-F 8am-5pm    [place] - CALL [phone]    [place]---PORT  [address], [place], [place]  Phone: [phone]  Hours: M-F 8am to 5pm    [facility]  [address], [place]  Phone: [phone]  Hours: M-F 8am-5pm    [facility]  [address] [place], [place]  Phone: [phone]  Hours: M-F 8am-3pm    Kevan Ny - CALL [phone]    Melina Copa  [address] [facility] [address] [place], [place]  Phone: [phone]   Hours: M-F 8am-3pm    [place]---[facility]  [address] [place], [place]   Phone: [phone]  Hours: M-F 8am-3pm    [place]--Family Service of the [place]  [address], [place]  Phone: [phone]  Hours: M-F 8am to 3pm    [place]--[facility]HA  [address] [place], [place]  Phone: [phone]  Hours: M-F 8am to [facility]   [address] [place], [place]  Phone: [phone]  Hours: [place] 8:30am to 3pm    [place]--CALL [phone]    [facility]  [address] [place], [place]  Phone: [phone]  Hours: M-F Para Skeans [phone]    Melba Coon [phone]     [facility]  [address] [place], [place]  Phone: [phone]  Hours: M-F 8am-3pm    [facility]  [address], [place], [place]  Phone: [phone]  Hours: M-F 8am-5pm    Charolette Forward [phone]    [facility]  [address] [place], [place]  Phone: [phone]  Hours: M-F 8am-3pm    Christell Constant [facility]  [address] [place], [place]  Phone: [phone]  Hours: M-F 8am-3pm    Burt Ek  [address] [place], [place]  Phone: [phone]  Hours: M-F 8am to 1pm  [place]---PORT  [address] [place], [place]  [phone]  M-F 8to 5    [place]--[facility]  [address] [place], [place]  [phone]  M-F 8am to 6pm    [facility] [place]  [address], [place]  [phone]  M-F 8-5    Shelly Bombard [phone]     [facility]  [address] [place], [place]   Phone: [phone]    [facility]ina [facility] [place]  [address] [place], [place]  [phone]    [facility]  [address]  [phone]    [place] - CALL [phone]    [place]---PORT  [address] [place], [place]  Phone: [phone]   Hours: M-F 8am to 5pm    [facility]  [address] [place], [place]  Phone: [phone]  Hours: M-F 8am to 5pm    [place]---PORT  [address] [place], [place]  Phone: [phone]  Hours: Wednesday 8am-5pm    [facility]  [address], [place], [place]  Phone: [phone]  Hours: M 8-12, T-F, 1-5pm    [facility]  [address] [place], [place]  Phone: [phone]  Hours: M-F 8am-3pm    [facility]  [address] [place], [place]  Phone: [phone]  Hours: M-F 8am to 3pm    [facility]  [address] [place], [place]  Phone: [phone]  Hours: M-F 8am-3pm    [facility]  [address] [place], [place]  Phone: [phone]   Hours: M-F 8am-3pm     [facility]  [address] [place], [place]   Phone: [phone]  Hours: M, W, F 8am to 11am     [facility]---[facility]  [address] [place], [place]  Phone: [phone]  Hours: M-F 8am-11am, 1pm to 4pm     [place]---[facility]  [address] [place], [place]  Phone: [phone]  Hours: M-F Derek Jack - CALL [phone]    [place]  [address] [place]  Phone: [phone]    [place] - CALL [phone]    [facility]  [address] [place], [place]  Phone: [phone]  Hours: M-F 8am-5pm     Lucila Maine  [address] [place], [place]  Phone: [phone]  Hours: M-F 8am-3pm

## 2023-03-18 NOTE — Unmapped (Signed)
 INITIAL BEHAVIORAL HEALTH ADULT ASSESSMENT    Virtual Visit     PCP: Amie Portland, FNP  Address on File: [address], [place] [address], [place]      Verified as Current Location: Yes  Extended Emergency Contact Information  Primary Emergency Contact: Yangala,[name]  Address: [address] [facility]           [place] , [place] [address] Darden Amber of [place] Phone: [phone]  Relation: Daughter  Secondary Emergency Contact: Josephine Igo  Address: [address]            [place] , [place] [address] Darden Amber of [place] Phone: [phone]  Relation: Daughter     Verified Behavioral Health Emergency Contact: Primary      The patient reports they are physically located in [place] and is currently: at home. I conducted a phone visit.  I spent 36 minutes on the phone call with the patient on the date of service .     Kalii Pitner, 73 y.o., female is here because: I've felt more stressed since I lot my job about a year ago.     [frequency, intensity, duration, impact, current meds/efficacy]:   She presents with the following symptoms: --DEPRESSION--, depressed mood, insomnia, fatigue, poor appetite, feelings of worthlessness/guilt, --ANXIETY--, feelings nervous or on edge, worrying about different things, and irritable. Onset of symptoms was approximately 1 year ago.  Symptoms have been gradual worsening since that time. She denies current suicidal and homicidal ideation. Family history significant for  none reported . Possible organic causes contributing are: none. Risk factors: negative life event lost job in 2024 and move to new state . Ms. Hagerman reports this impacts her functioning in that she has been unable to find work over the past year, she worries about finances and having money to pay the bills, and she has more limited support with living further away from one of her daughters and it's difficult navigating community resources in a different language.        Current Outpatient Medications:     alendronate (FOSAMAX) 70 MG tablet, Take 1 tablet (70 mg total) by mouth every seven (7) days., Disp: 12 tablet, Rfl: 1    benzonatate (TESSALON) 100 MG capsule, prendre un comprim?? toutes les 8 heures au besoin pour la toux, Disp: 20 capsule, Rfl: 0    bictegrav-emtricit-tenofov ala (BIKTARVY) 50-200-25 mg tablet, Take 1 tablet by mouth in the morning., Disp: 90 tablet, Rfl: 3    calcium carbonate-vitamin D3 600 mg-10 mcg (400 unit) per tablet, Take 1 tablet by mouth daily., Disp: , Rfl:     cetirizine (ZYRTEC) 10 MG tablet, Take 1 tablet (10 mg total) by mouth daily., Disp: , Rfl:     fluticasone propionate (FLONASE) 50 mcg/actuation nasal spray, 1 spray into each nostril daily. 1 pulv??risation dans chaque narine par jour, Disp: 16 g, Rfl: 0    LAGEVRIO, EUA, 200 mg capsule, TAKE 4 CAPSULES BY MOUTH TWICE A DAY X 5 DAYS, Disp: , Rfl:     levothyroxine (SYNTHROID) 125 MCG tablet, Take 1 tablet (125 mcg total) by mouth daily., Disp: 90 tablet, Rfl: 0    polyvinyl alcohol/povidone/PF (REFRESH CLASSIC, PF, OPHT), Apply to eye., Disp: , Rfl:     sulfamethoxazole-trimethoprim (BACTRIM) 400-80 mg per tablet, Take 1 tablet (80 mg of trimethoprim total) by mouth in the morning., Disp: 90 tablet, Rfl: 0  Not currently on medications for this problem and patient  prefers to try behavioral interventions before considering medication. Patient to follow up with PCP to discuss medication options if symptoms fail to improve or worsen after trying behavioral interventions.     Psych history  Psych history: None reported    SYMPTOMS    Depression  Depressed mood, Sleep disturbance, Appetite disturbance, Feelings of worthlessness or guilt, Fatigue or loss of energy, and Irritability    PHQ-9 completed during this session. PHQ-9 TOTAL SCORE: 11  Purpose of screener discussed and score explained to patient.  PHQ-9 PHQ-9 TOTAL SCORE   03/18/2023   9:00 AM 11   01/21/2023  10:00 AM 12 PHQ-9 Score: 11    Screening complete, depression identified / today's follow-up action documented in note      Mania  Has there ever been a period of at least four days when you were so happy or excited that you got into trouble, or your family or friends worried about it or a doctor said you were manic? no    If yes, continue assessing for bipolar disorder. Diagnostic criteria include the concurrent presence of at least FOUR of the following symptoms (one of which must be the first symptom listed): Bipolar Disorder: Not applicable    Anxiety  Excessive anxiety or worry and Irritability    GAD-7 completed during this session. GAD-7 Total Score: 7 Purpose of screener discussed and score explained to patient.  GAD7 Total Score GAD-7 Total Score   03/18/2023   9:15 AM 7   01/21/2023  10:00 AM 10       Psychosis  No symptoms reported    Trauma related  Patient reports PTSD symptoms - did not discuss specifics - will continue to assess for trauma-focused treatment needs.    Substance Use  None reported    Adjustment Disorder  No symptoms reported    Pain  No symptoms reported    Sleep  Sleeps too little and Feels tired, sleepy, or fatigued during the day    Current contributing stressors   Occupational , Family/relationships, Financial, and Environmental    Strengths   Physical health issues, Occupational , Family/relationships, and Motivation for treatment    Social history      Are there any racial, cultural, or religious values or experiences that you feel are important for me to know?   Patient immigrated to the [place] and lived in [place] for several years. Patient moved to [place] in 2022 to help daughter with her health issues. Patient is [place] speaking.         Current family structure (Include previous relationships if relevant):  Widowed    What information would be good for me to know about your family of origin that currently affects you?   One of my daughters lives in [place] - has health issues - goes back and forth between [place] and [place]. I moved to [place] in 2022. I lived in [place] before 2022 - moved to [place] due to help with daughter's health issues - has another daughter in [place] - lower cost of living.             MENTAL STATUS EXAM    Appearance:   Not applicable   Behavior:  Calm, Cooperative, Engaged   Motor:  Not applicable   Speech/Language:   Normal rate, volume, tone, fluency   Mood:  Euthymic   Affect:  Full range, appropriately reactive, mood congruent   Thought process:  Logical,  Linear, Clear, Coherent, and Goal directed   Thought content:    Denies SI, HI, self-harm, delusions, obsessions, paranoid ideation, or ideas of reference   Perceptual disturbances:    Denies auditory and visual hallucinations   Orientation:  Oriented to person, place, time and general circumstances   Attention:  Alert and attentive   Concentration:  Able to fully concentrate and attend   Memory:  Immediate short-term, long-term, and recall grossly intact    Fund of knowledge:   Consistent with level of education and development   Insight:    No observable deficit   Judgment:   No observable deficit   Impulse Control:  No observable deficit     SAFETY SCREENING  Does patient have access to guns/firearms? No    Suicide Risk Factors   EMB Suicide Risk: A suicide risk assessment was performed during this evaluation. This patient is not deemed to be at current risk for suicide. , A suicide risk assessment was performed during this evaluation. This patient is not deemed to be at current risk for suicide.  Suicide risk: Denies SI and Trauma history/PTSD    Violence Risk Factors  A violence risk assessment was performed during this assessment. This patient was not deemed to be at elevated risk for violence.  Violence Risk: Denies HI    Mitigating Factors  These risk factors are mitigated by the following factors:  No known access to weapons, No history of previous suicide attempts, No history of violence, Motivation for treatment, Supportive family, Sense of responsibility to family and social supports, Sense of belonging, Presence of any significant relationship, Presence of an available support system, Expresses purpose for living, and Current treatment compliance        PLAN     Orientation to Brief Model    Oriented patient to brief model including:  Up to 12 sessions  Focus on here and now versus past  Identify one or two specific goals to work on  [employer] will be an expected part of treatment process  Community resources for additional behavioral health services will be provided to patient if needed or requested.  Behavioral health visits follow the [facility] policy for no show appointments. Patients who no show, arrive late or cancel within 24 hours of scheduled appointments 3 times within 12 months with an individual provider can be dismissed from the practice. Patient will be contacted after each no show and given the opportunity to reschedule. Exceptions include events that are out of the patient's control including, but not limited to: hospital admission, death in family, accidental, illness. If the clinic has a delayed opening, patients won't be penalized for late arrival.       Treatment Model Agreement: Therapist explained brief treatment model. After consideration, LCSW and patient agreed that she has behavioral health needs that can be best addressed with brief treatment model. Patient and LCSW are in agreement to utilize brief interventions, will continue to monitor progress, and adjust treatment approach based on patient functioning.      RESOURCES    Community Resources  LME/MCO    Emergency Resources  LME/MCO county specific crisis resources have been added to the AVS when necessary    Additional resources available 24 Hours a day:    988 Suicide and Engineer, mining or text 988  Online Chat: www.988lifeline.org    The Hopeline   Phone: (226)595-1361    Online chat: www.hopeline.com    [employer]:  1-800-SUICIDE (702) 301-3851)    National Suicide Prevention Lifeline    Phone: (737)691-3148 (2165503578)   Online chat: www.suicidepreventionlifeline.org    Online chat (ages 58-24): https://[email or web address]/         Educational Resources   Depression  Anxiety  Self-care  Diet/nutrition  Stress management  Sleep hygiene  Physical activity    Referrals  PCP and patient to follow up with PCP to discuss medication options if symptoms fail to improve or worsen after trying behavioral interventions.    Social Determinants of Health screened today. Interventions provided: Yes - I provided an intervention for the [employer], Geophysicist/field seismologist, Housing, and Transportation Needs SDOH domain. The intervention was Provided [employer] and Counseling     Homework assigned for next session:  Ms. Geerdes is seeking support with managing Depression Unspecified and Anxiety Disorder Unspecified Type by increasing goal-directed tasks (e.g. connecting to job resources, food resources) from 0 times per week to 5 times per week or more over the next 4-6 months, managing Anxiety Disorder Unspecified Type by decreasing intrusive thoughts/worries and/or irritable reactions from several times per week to once per week or less over the next 4-6 months, and managing PTSD by decreasing trauma reactions from several times per week to once per week or less over the next 4-6 months. Assigned to schedule first psychotherapy session. Scheduled for 03/31/23 with Valentino Saxon Ronita Hargreaves, LCSW.         Anything else pertinent that wasn't addressed during assessment?   Patient wants to connect with additional supports and resources for food and housing stability and job resources. Patient will continue talking with Medicaid social worker and Facilities manager for additional supports/resources.    Staff provided patient with info about subsidized housing resources and food pantries for her area.                   Name: Joan Burns   Date: 03/19/2023    MRN: [id number]   DOB: [birth year]    PCP: Amie Portland, FNP  Psychotherapy Treatment Plan    # Goal Intervention Person Responsible Frequency and Duration   1. Jacalyn will manage Depression Unspecified and Anxiety Disorder Unspecified Type by increasing goal-directed tasks (e.g. connecting to job resources, food resources) from 0 times per week to 5 times per week or more over the next 4-6 months.  Individual behavioral health counseling/psychotherapy in the outpatient setting. Jihan &/or legally responsible person; Paytin Ramakrishnan A Keiondre Colee, LCSW We will meet every one month &/or per patient request. Frequency of outpatient psychotherapy sessions will decrease as Takeyla reaches Pronouns: she/her/hers treatment goals. Frequency of outpatient sessions may increase should new needs arise.   2. Donnisha will manage Anxiety Disorder Unspecified Type by decreasing intrusive thoughts/worries and/or irritable reactions from several times per week to once per week or less over the next 4-6 months. Individual behavioral health counseling/psychotherapy in the outpatient setting. Evin &/or legally responsible person; Eder Macek A Ketty Bitton, LCSW We will meet every one month &/or per patient request. Frequency of outpatient psychotherapy sessions will decrease as Kemoni reaches Pronouns: she/her/herstreatment goals. Frequency of outpatient sessions may increase should new needs arise.   3. Maritta will manage PTSD by decreasing trauma reactions from several times per week to once per week or less over the next 4-6 months. Individual behavioral health counseling/psychotherapy in the outpatient setting. [place] &/or legally responsible person; Ballard Budney A Ramar Nobrega, LCSW We will meet every one month &/or per patient request. Frequency of outpatient psychotherapy sessions will decrease as  Bethan reaches Pronouns: she/her/herstreatment goals. Frequency of outpatient sessions may increase should new needs arise.     Date Goal is (R) Revised,   (A) Achieved, (N) New,   Ongoing (O), Discontinued (D) Progress Towards Goal   03/18/23 New Goal # 1 is new. Progress towards meeting this goal will be assessed at a future date.   03/18/23 New Goal # 2 is new. Progress towards meeting this goal will be assessed at a future date.   03/18/23 New Goal # 3 is new. Progress towards meeting this goal will be assessed at a future date.                 Page 2 (Allesandra Scogin Treatment Plan)    Patient Signature Therapist Signature Date   Verbal Agreement via Phone Visit Verbal Agreement via Phone Visit Mar 18, 2023                     Number of behavioral health visits in the last 18 months : 0

## 2023-03-24 NOTE — Unmapped (Addendum)
 RN follow-Up:  Called patient via [place] interpreter. Daughter answered phone and could speak english. She would like Corazon to call her back. Would not tell me what it was regarding.   ----------------------------------------------------------------------------------------------      ----- Message from Karalee Height sent at 03/24/2023 10:55 AM EST -----  Incoming Call  Caller: Bess Lacewell  Best callback number: [phone]  Reason for call: Patient would like a call back please        Thank you!

## 2023-03-24 NOTE — Unmapped (Signed)
 [name] daughter called clinic. Returned call. She is concerned because she feels that her siblings are conspiring to get her mom evicted so that they can move her somewhere else and take advantage of her financial resources. Initial plan was to move mom to [place] but that fell through and now they want to move her to [place] but patient does not want to go. Joan Burns (patient's daughter, main contact) has tried to advocate for her mom. Current situation is that her mom does not have a job right now and cannot pay her rent. One of patient's daughters volunteered to pay some rent temporarily but can no longer pay. Joan Burns wanted to take over payments and can assist a little bit at a time but sibling changed the contact information for the rent communications and now she is worried that the back rent will accumulate so much that she will not be able to pay it and then mom will get evicted, making it harder for patient to find new housing. Joan Burns has connected with [facility] which is a service she thinks through patient's Medicaid. That social worker is helping Joan Burns become her mother's health care POA but she reached out to [facility] today to see if there's a way for her to become healthcare proxy at [facility] as well. I will connect patient with SW team to see if they can assist with helping her get the proper documents for Healthcare POA and hopefully give her resources for legal aid to help patient make Joan Burns her durable power of attorney if she so wishes. Also they might know of other resources to help avoid patient being evicted though it seems that Joan Burns just needs to be able to contact the rental company to be able to pay her rent.

## 2023-03-24 NOTE — Unmapped (Deleted)
-----   Message from Edisto P sent at 03/24/2023 10:55 AM EST -----  Incoming Call  Caller: Haleema Snell  Best callback number: [phone]  Reason for call: Patient would like a call back please        Thank you!

## 2023-03-31 ENCOUNTER — Ambulatory Visit
Admit: 2023-03-31 | Discharge: 2023-04-01 | Payer: BLUE CROSS/BLUE SHIELD | Attending: Mental Health | Primary: Mental Health

## 2023-03-31 DIAGNOSIS — F419 Anxiety disorder, unspecified: Principal | ICD-10-CM

## 2023-03-31 DIAGNOSIS — F4310 Post-traumatic stress disorder, unspecified: Principal | ICD-10-CM

## 2023-03-31 DIAGNOSIS — F32A Depression, unspecified depression type: Principal | ICD-10-CM

## 2023-03-31 NOTE — Unmapped (Signed)
 RETURN BEHAVIORAL HEALTH VISIT    Date of Service:  03/31/2023     I personally spent 42 minutes in psychotherapy for treatment of depression and anxiety. Arlina Robes, LCSW         Virtual Visit     PCP: Amie Portland, FNP  Address on File: [address], [place] [address], [place]      Verified as Current Location: Yes  Extended Emergency Contact Information  Primary Emergency Contact: Yangala,[name]  Address: [address] [facility]           [place] , [place] [address] Darden Amber of [place] Phone: [phone]  Relation: Daughter  Secondary Emergency Contact: Josephine Igo  Address: [address]            [place] , [place] [address] Darden Amber of [place] Phone: [phone]  Relation: Daughter     Verified Behavioral Health Emergency Contact: Primary      The patient reports they are physically located in [place] and is currently: at home. I conducted a phone visit.  I spent 42 minutes on the phone call with the patient on the date of service  - patient declines video visits.     Service: Individual therapy with patient    Individuals Present: Patient and Daughter, Therapy Provider      PHQ-9 completed during this session. PHQ-9 Total Score: 11  Purpose of screener discussed and score explained to patient.         PHQ-9 Score: 11      Screening complete, depression identified / today's follow-up action documented in note      PHQ-9 PHQ-9 Total Score   03/31/2023  11:00 AM 11   03/18/2023   9:00 AM 11   01/21/2023  10:00 AM 12         GAD-7 completed during this session. GAD-7 Total Score: 7 Purpose of screener discussed and score explained to patient.    GAD7 Total Score GAD-7 Total Score   03/31/2023  11:00 AM 7   03/18/2023   9:15 AM 7   01/21/2023  10:00 AM 10           Goals:  managing Depression Unspecified and Anxiety Disorder Unspecified Type by increasing goal-directed tasks (e.g. connecting to job resources, food resources) from 0 times per week to 5 times per week or more over the next 4-6 months, managing Anxiety Disorder Unspecified Type by decreasing intrusive thoughts/worries and/or irritable reactions from several times per week to once per week or less over the next 4-6 months, and managing PTSD by decreasing trauma reactions from several times per week to once per week or less over the next 4-6 months       Intervention:  Motivational Interviewing  Cognitive Behavioral Therapy  Mindfulness  Feelings Processing  Enjoyable Activities  Natural Supports      Effectiveness:  ([name] progress towards goals)   Patient has made some progress towards increasing goal-directed tasks as evidenced by patient's report that she has been doing some planting in her garden over the past week. Patient was engaged in session and sounded pleasant over the phone. Patient reports feeling better over the past week - feeling better with the sunny and warmer weather, good spirits this week. Daughter reports that patient got her new residency card this week and daughter helped with rent this month, which has helped patient feel less worried. Patient reports that she went outside to walk around  today - made a little garden in backyard - thinking about planting new things for garden. Daughter reports that patient often feels down when other children are not helping or saying negative things. Patient reports feeling better when she reminds herself of her support system - daughter in [place] is there to help and have her back, has a really good friend (in [place]) who checks on her most days. Patient reports that doing a check-in for therapy visits every 2 weeks helps her with feeling better and feeling supported. Daughter reports that she is looking into helping patient's friend get a visa to visit patient.      Plan: (What will be addressed in future sessions?)  Patient to follow up with this provider in 2-3 weeks.  Patient/family to contact food and housing resources as needed.  Patient to continue getting outside to do enjoyable activities and checking-in with her natural supports.  Patient not currently on medications for this problem and patient prefers to try behavioral interventions before considering medication. Patient to follow up with PCP to discuss medication options if symptoms fail to improve or worsen after trying behavioral interventions.       Safety Screening    Suicide Risk Factors   EMB Suicide Risk: A suicide risk assessment was performed during this evaluation. This patient is not deemed to be at current risk for suicide.   Denies SI    Violence Risk Factors  A violence risk assessment was performed during this assessment. This patient was not deemed to be at elevated risk for violence.  Violence Risk: Denies HI    Mitigating Factors  These risk factors are mitigated by the following factors:  No known access to weapons, No history of previous suicide attempts, No history of violence, Motivation for treatment, Utilization of positive coping skills, Supportive family, Sense of responsibility to family and social supports, Sense of belonging, Presence of any significant relationship, Presence of an available support system, Hobbies, Expresses purpose for living, and Current treatment compliance    Community Resource Needs  Patient was given local MCO contact information and a brief explanation of the [facility] system.       Emergency Resources  LME/MCO county specific crisis resources have been added to the AVS when necessary    Additional Resources Available 24 Hours a Day:    45 Suicide and Crisis Lifeline  Call or text 988  Online Chat: www.988lifeline.org    The Hopeline   Phone: 2201572529    Online chat: www.hopeline.com    [employer]   Phone: 1-800-SUICIDE (651-854-5353)    National Suicide Prevention Lifeline    Phone: 9-562-130-QMVH ((249) 607-8682)   Online chat: www.suicidepreventionlifeline.org    Online chat (ages 76-24): https://gibson.com/        Number of behavioral health visits in the last 18 months : 0           ----------------  Therapy Session Provided by Behavioral Health Staff:  Arlina Robes, LCSW

## 2023-04-02 NOTE — Unmapped (Signed)
 INFECTIOUS DISEASES CLINIC  [address]  [place], [place]  [address]  P [phone]  F [phone]     Primary care provider: Amie Portland, FNP    Assessment/Plan:      [name] primary contact is still Joan Burns (daughter). She schedules patient's Medicaid transport and manages her medical care. This may change down the road but Joan Burns and patient firm that she is still main contact.    HIV (dx'd 2015, nadir CD4 76/4% in 07/20/20)  - chronic, severe  Patient diagnosed with HIV in 2015 as part of pre-op work up for myomectomy. Per report from daughter, patient had declined treatment and her daughters through the years had tried to get her into care but was unable to, both here in [place] and in [place]. They were unable to establish care for her in [place] because her Medicaid was in [place].    Patient speaks Tshiluba or Luba-Kasai, a [place] language, that can be accessed by [employer] (via telephone [phone]) if an appointment is made with them. Patient actually speaks a combination of Swahili and Tshiluba but prefers interpretation in Tshiluba/Luba-Kasai. [place] interpreter can be used if Tsiluba/Luba-kasai interpreter cannot be found. Does not prefer Swahili.    [place] interpreter used which was patient's preference. Patient is unaccompanied. Discussed care with Joan Burns after the visit today.    Overall doing well. Current regimen: Biktarvy (BIC/FTC/TAF)  Misses doses of ARVs never     Med access via Medicaid  CD4 count  102/6% after starting Biktarvy , on Bactrim SS daily. Repeat CD4 today. Will stop Bactrim if continues to be virally suppressed.  Discussed ARV adherence and taking ARVs with food    Lab Results   Component Value Date    ACD4 130 (L) 04/03/2023    CD4 5 (L) 04/03/2023    HIVRS Not Detected 01/02/2023    HIVCP 55 (H) 10/02/2021     CD4, HIV RNA, and safety labs (full return panel)   Continue current therapy On Biktarvy and Bactrim SS, can stop Bactrim today (04/04/23) Discussed with Dr. Rhett Bannister who agrees with plan.   Discussed importance of ARV adherence      Right breast mass, incidentally found - chronic  Patient had not reported breast mass at previous visits though according to patient and daughter this was a mass present for some years. Patient reports size is stable. No LAD on exam.  Obtain chest x-ray - unremarkable results today  Obtain diagnostic bilateral mammogram with ultrasound if needed. - Appointment made for 02/11/23, soonest available. Joan Burns (daughter) is planning to come to [place] so that she can accompany patient to this visit.  02/11/23 mass imaged. 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  Reports having malaria treatment x 3 months with oral medication. Followed up with doctor in [place] who said she was cured.  Had joint pain with infection, now resolved.  No symptoms since treatment.  Discussed with Dr. Charlestine Massed who does not recommend any medications at this time but if she becomes symptomatic - malaise, fatigue, fevers, chills, should order [id number] - Malaria exam, peripheral blood.      HIV retinopathy  -  chronic    Diagnosed by Ophthalmology, Dr. Larry Sierras  Incidental finding of CWS when patient being evaluated for cataracts.  Daughter reports that  patient has not complained of vision changes prior to this diagnosis.  Continue close follow up with Ophthalmology, next appointment due 06/2021 but has not followed up - still needs to schedule      +HepC Ab/HCV RNA not detected (03/2020)  Consider baseline ultrasound.   Confirm no treatment for Hep C  01/02/23 Hep C RNA not detected      HSV-2 infection/Perianal lesions  -  resolved  Reports that lesion started by itching, which became a vesicle and then because of itching, the lesions opened. Watery, clear discharge has been draining since then, leasions seems to be getting bigger. Not experiencing pain, but is itchy.   07/11/20 Evaluated by PCP with surface swab revealing mixed gram positive organisms. Started on Bactrim SS BID.  Dr. Matt Holmes in to evaluate patient along with myself. Skin erosions are characteristic of HSV infection that may also be superinfected with bacteria.  Patient given Valtrex 1000mg  daily for HSV suppression. Refills x 1 year      History of CoVid infection (dx'd 09/25/21) - treated  Evaluated at [facility] with the following symptoms: congestion, sinus pressure, nonproductive cough, and some chills.   Prescribed molnupiravir and tessalon perles- took the medications      Uterine leiomyoma - chronic  Had uterine surgery in 2014 per patient report  Seen by GYN 12/27/20 and no further evaluation needed at this time other than routine well woman exam.       Hypothyroidism - chronic, stable  Managed by PCP      Alcohol use disorder - chronic - not addressed today  Drinks 5 beers or wine a day  Since being on antibiotics, patient drinks 1-2 drinks daily.    Right knee pain - chronic, exacerbated - did not address at this visit  Patient has chronic right knee complaints, evaluated by ED for it on 09/25/21 (advised to see PCP about this) and Dr. Rosine Beat with [facility] on 10/02/21.   [place] PVL ordered for patient and has appointment 10/31/21.  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  Right knee today is slightly swollen and tender somewhat to touch though no signs of infection. Has pain with opposing forces to RLE.  At last visit with Dr. Rosine Beat, imaging ordered with possibility for knee injection.  On 11/02/21, seen for knee pain. Given orders for PT and IBU. Suspected autoimmune disorder.   Patient continues to have bilateral knee pain, unable to address with competing priorities.  Needs to schedule PT.    Reported history of Bell's Palsy and Shingles outbreak    Sexual health & secondary prevention  -  abstinent  Not in relationship. Last sexual encounter >5 years ago, remains abstinent  She  is abstinent  She does not routinely discuss HIV status with partner(s).  Has children and post menopausal .    Lab Results   Component Value Date    RPR Nonreactive 01/02/2023    RPR Nonreactive 07/20/2020     GC/CT NAATs - not being checked routinely for this patient  RPR - for screening obtained today      Health maintenance  - chronic, acutely exacerbated  PCP: Ali Lowe, FNP/ Dr. Rosine Beat  Elevated A1C and lower H/H, has appointment 04/11/23    Oral health  She  unknown  have a dentist. Last dental exam unknown.    Eye health  She does  use corrective lenses. Last eye exam followed by Opthalmology.    Metabolic conditions  Wt Readings from Last 5 Encounters:   04/03/23 76.6 kg (168 lb 12.8 oz)   01/21/23 80.2 kg (176 lb 14.4 oz)   01/02/23 80.1 kg (176 lb 9.6 oz)   12/26/22 78.9 kg (174 lb)   11/02/21 71.7 kg (158 lb)     Lab Results   Component Value Date    CREATININE 0.65 04/03/2023    GLUCOSEU Trace (A) 01/02/2023    ALBCRERAT 2.0 01/02/2023    GLU 155 04/03/2023    A1C 8.6 (H) 04/03/2023    ALT 11 04/03/2023    ALT 11 01/02/2023    ALT 11 10/30/2021     # Kidney health - SCr and eGFR   # Bone health - defer mgm't to PCP, on Fosamax  # Diabetes assessment - random glucose today  # NAFLD assessment - monitor over time    Communicable diseases  Lab Results   Component Value Date    QFTTBGOLD Negative 06/29/2020    HEPAIGG Reactive (A) 07/20/2020    HEPBSAB Reactive (A) 07/20/2020    HEPCAB Reactive (A) 04/14/2020    HCVRNA Not Detected 01/02/2023     # TB screening - no longer needed; negative IGRA, low risk  # Hepatitis screening -  as noted:  See above, Hep A/B IMMUNE  # MMR screening - not assessed    Cancer screening  No results found for: PSASCRN, PSA, PAP, FINALDX  # Cervical -  Defer management to GYN  # Breast - repeat mammogram 76m from prior    # Anorectal -  will disucss  # Colorectal -  Defer to PCP  # Liver - no screening indicated  # Lung - screening not indicated    Cardiovascular disease  Lab Results   Component Value Date    CHOL 173 04/03/2023    HDL 39 (L) 04/03/2023    LDL 109 (H) 04/03/2023    NONHDL 134 (H) 04/03/2023    TRIG 125 04/03/2023     # The 10-year ASCVD risk score (Arnett DK, et al., 2019) is: 16%  - is not taking aspirin   - is not taking statin  - BP control good  - never smoker  # AAA screening - no indication for screening    Immunization History   Administered Date(s) Administered    COVID-19 VACCINE,MRNA(MODERNA)(PF) 10/06/2020, 11/03/2020    Influenza Vaccine Quad(IM)6 MO-Adult(PF) 10/30/2021     Immunizations- needs PCV-21  Patient received primary CoVid series and reports getting subsequent CoVid boosters through her work place.- patient currently not working    I personally spent 35 minutes face-to-face and non-face-to-face in the care of this patient, which includes all pre, intra, and post visit time on the date of service.   Time for translation extended out our discussions.      Disposition  Next appointment: 3 months       To do @ next RTC  Discuss anal pap.  Alcohol history  Consider abdominal ultrasound  PCV-21      Varney Daily, FNP-BC  [facility] Diseases [facility] at [facility]  [address] [place], [place] [address]    Phone: [phone]   Fax: [phone]             Subjective      Chief Complaint   HIV follow up    HPI  In addition to details in A&P  above:    Daughter lives in [place] at this time and will travel back and forth occasionally to help with health care for patient. Patient has 3 daughters but Joan Burns is her primary caregiver out of her daughters.  Denies any fever, chills, nausea, vomiting, rash, urinary complaints, diarrhea  Feeling good overall, denies any side effects from medications  Has knee pain as above.    12/26/22  Was in [place] for 8 months - had supply of medications  Had malaria while she was there, had malaria treatment while she was there. Occurred about 6 months before she returned to the [place]. Came back in May 2024.  Denies any fever, chills, nausea, vomiting, rash, urinary complaints, diarrhea, constipation.  Has put on 14 pounds since last visit.  Pain in chest, difficulty breathing she thinks from gaining weight. Sharp poking pain midsternally, noise with breathing. Happens at rest when sleeping, laying down, wakes her up, lasts about 10-15 minutes, drinks water and then restarts, can't sleep well. Happens every 3rd week. Last happened last Friday. Started 2 months ago.  Bilateral knees and back hurts.  Joan Burns is having a lot of health problems right now. She is trying to see if her sister in [place] can help her mom but for now she is the main contact for care of her mother. Patient agrees.    04/03/23  Received a letter about rent. Not sure what is happening with April rent. Has met with Child psychotherapist.  Problems with SNAP benefits - SW in to see patient today  In ED 03/02/23 seen for flu. Sent to breast clinic by [facility].  Has had insomnia since having fever several weeks ago. Stays awake until 1 or 2 in the morning and has difficulty sleeping at night. Wakes up around 9 or 10 am. Spoke to Elizabeth about this, she reports that another provider recommended using melatonin but it has not been bought by family helping mom at this time. She believes mom is sleeping well but is bored by not working so thinks she does not sleep that long.  Has nausea in the morning since having the fever. No vomiting. Has had some cough. During visit, patient reported that cough was keeping her up at night but patient did not cough during visit and daughter reports that patient is not coughing much and is able to sleep. I canceled tessalon perles script.    Past Medical History:   Diagnosis Date    Dermatitis 04/14/2020    Herpes simplex infection of perianal skin     HIV disease (CMS-HCC)     Other osteoporosis without current pathological fracture     Other specified hypothyroidism     Pressure injury of buttock, stage 2 (CMS-HCC) 07/11/2020       Social History  Background - From [place]. No longer works. Currently lives in [place], [place].    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 All others negative.      Medications and Allergies  She has a current medication list which includes the following prescription(s): alendronate, biktarvy, calcium  carbonate-vitamin d3, cetirizine, fluticasone propionate, lagevrio (eua), levothyroxine, polyvinyl alcohol/povidone/pf, and valacyclovir.    Allergies: Tetanus-diphtheria toxoids-td      Family History  Her family history is not on file.          Objective:      BP 120/91  - Pulse 90  - Ht 149.9 cm (4' 11)  - Wt 76.6 kg (168 lb 12.8 oz)  - BMI 34.09 kg/m??   Wt Readings from Last 3 Encounters:   04/03/23 76.6 kg (168 lb 12.8 oz)   01/21/23 80.2 kg (176 lb 14.4 oz)   01/02/23 80.1 kg (176 lb 9.6 oz)       Const looks well and attentive, alert, appropriate   Eyes sclerae anicteric, noninjected OU   ENT no thrush, leukoplakia or oral lesions   Lymph no cervical or supraclavicular LAD   CV RRR. No murmurs. No rub or gallop. S1/S2.   Lungs CTAB ant/post, normal work of breathing   GI Soft, no organomegaly. NTND. NABS.   GU deferred   Rectal deferred   Skin no petechiae, ecchymoses or obvious rashes on clothed exam   MSK normal ROM throughout       Psych Appropriate affect. Eye contact good. Linear thoughts. Fluent speech.     Laboratory Data  Reviewed in Epic today, using Synopsis and Chart Review filters.    Lab Results   Component Value Date    CREATININE 0.65 04/03/2023    QFTTBGOLD Negative 06/29/2020    HEPCAB Reactive (A) 04/14/2020    HCVRNA Not Detected 01/02/2023    CHOL 173 04/03/2023    HDL 39 (L) 04/03/2023    LDL 109 (H) 04/03/2023    NONHDL 134 (H) 04/03/2023    TRIG 125 04/03/2023    A1C 8.6 (H) 04/03/2023

## 2023-04-03 ENCOUNTER — Ambulatory Visit: Admit: 2023-04-03 | Discharge: 2023-04-04 | Payer: BLUE CROSS/BLUE SHIELD | Attending: Family | Primary: Family

## 2023-04-03 DIAGNOSIS — Z9189 Other specified personal risk factors, not elsewhere classified: Principal | ICD-10-CM

## 2023-04-03 DIAGNOSIS — B009 Herpesviral infection, unspecified: Principal | ICD-10-CM

## 2023-04-03 DIAGNOSIS — Z79899 Other long term (current) drug therapy: Principal | ICD-10-CM

## 2023-04-03 DIAGNOSIS — Z131 Encounter for screening for diabetes mellitus: Principal | ICD-10-CM

## 2023-04-03 DIAGNOSIS — B20 Human immunodeficiency virus [HIV] disease: Principal | ICD-10-CM

## 2023-04-03 DIAGNOSIS — R051 Acute cough: Principal | ICD-10-CM

## 2023-04-03 DIAGNOSIS — Z5181 Encounter for therapeutic drug level monitoring: Principal | ICD-10-CM

## 2023-04-03 DIAGNOSIS — Z13220 Encounter for screening for lipoid disorders: Principal | ICD-10-CM

## 2023-04-03 LAB — LIPID PANEL
CHOLESTEROL/HDL RATIO SCREEN: 4.4 (ref 1.0–4.5)
CHOLESTEROL: 173 mg/dL (ref <=200)
HDL CHOLESTEROL: 39 mg/dL — ABNORMAL LOW (ref 40–60)
LDL CHOLESTEROL CALCULATED: 109 mg/dL — ABNORMAL HIGH (ref 40–99)
NON-HDL CHOLESTEROL: 134 mg/dL — ABNORMAL HIGH (ref 70–130)
TRIGLYCERIDES: 125 mg/dL (ref 0–150)
VLDL CHOLESTEROL CAL: 25 mg/dL (ref 11–41)

## 2023-04-03 LAB — CBC W/ AUTO DIFF
BASOPHILS ABSOLUTE COUNT: 0.1 10*9/L (ref 0.0–0.1)
BASOPHILS RELATIVE PERCENT: 0.9 %
EOSINOPHILS ABSOLUTE COUNT: 0.3 10*9/L (ref 0.0–0.5)
EOSINOPHILS RELATIVE PERCENT: 4 %
HEMATOCRIT: 28.2 % — ABNORMAL LOW (ref 34.0–44.0)
HEMOGLOBIN: 8.8 g/dL — ABNORMAL LOW (ref 11.3–14.9)
LYMPHOCYTES ABSOLUTE COUNT: 2.6 10*9/L (ref 1.1–3.6)
LYMPHOCYTES RELATIVE PERCENT: 35.3 %
MEAN CORPUSCULAR HEMOGLOBIN CONC: 31 g/dL — ABNORMAL LOW (ref 32.0–36.0)
MEAN CORPUSCULAR HEMOGLOBIN: 19.6 pg — ABNORMAL LOW (ref 25.9–32.4)
MEAN CORPUSCULAR VOLUME: 63 fL — ABNORMAL LOW (ref 77.6–95.7)
MEAN PLATELET VOLUME: 9.7 fL (ref 6.8–10.7)
MONOCYTES ABSOLUTE COUNT: 0.4 10*9/L (ref 0.3–0.8)
MONOCYTES RELATIVE PERCENT: 5.5 %
NEUTROPHILS ABSOLUTE COUNT: 4 10*9/L (ref 1.8–7.8)
NEUTROPHILS RELATIVE PERCENT: 54.3 %
PLATELET COUNT: 192 10*9/L (ref 150–450)
RED BLOOD CELL COUNT: 4.48 10*12/L (ref 3.95–5.13)
RED CELL DISTRIBUTION WIDTH: 20.5 % — ABNORMAL HIGH (ref 12.2–15.2)
WBC ADJUSTED: 7.4 10*9/L (ref 3.6–11.2)

## 2023-04-03 LAB — ALT: ALT (SGPT): 11 U/L (ref 10–49)

## 2023-04-03 LAB — BILIRUBIN, TOTAL: BILIRUBIN TOTAL: 0.4 mg/dL (ref 0.3–1.2)

## 2023-04-03 LAB — BASIC METABOLIC PANEL
ANION GAP: 9 mmol/L (ref 5–14)
BLOOD UREA NITROGEN: 8 mg/dL — ABNORMAL LOW (ref 9–23)
BUN / CREAT RATIO: 12
CALCIUM: 8.9 mg/dL (ref 8.7–10.4)
CHLORIDE: 102 mmol/L (ref 98–107)
CO2: 26.9 mmol/L (ref 20.0–31.0)
CREATININE: 0.65 mg/dL (ref 0.55–1.02)
EGFR CKD-EPI (2021) FEMALE: >90 mL/min/{1.73_m2} (ref >=60)
GLUCOSE RANDOM: 155 mg/dL (ref 70–179)
POTASSIUM: 4.1 mmol/L (ref 3.4–4.8)
SODIUM: 138 mmol/L (ref 135–145)

## 2023-04-03 LAB — LYMPH MARKER LIMITED,FLOW
ABSOLUTE CD3 CNT: 2314 {cells}/uL (ref 915–3400)
ABSOLUTE CD4 CNT: 130 {cells}/uL — ABNORMAL LOW (ref 510–2320)
ABSOLUTE CD8 CNT: 2132 {cells}/uL — ABNORMAL HIGH (ref 180–1520)
CD3% (T CELLS): 89 % — ABNORMAL HIGH (ref 61–86)
CD4% (T HELPER): 5 % — ABNORMAL LOW (ref 34–58)
CD4:CD8 RATIO: 0.1 — ABNORMAL LOW (ref 0.9–4.8)
CD8% T SUPPRESR: 82 % — ABNORMAL HIGH (ref 12–38)

## 2023-04-03 LAB — HEMOGLOBIN A1C
ESTIMATED AVERAGE GLUCOSE: 200 mg/dL
HEMOGLOBIN A1C: 8.6 % — ABNORMAL HIGH (ref 4.8–5.6)

## 2023-04-03 LAB — SLIDE REVIEW

## 2023-04-03 LAB — AST: AST (SGOT): 18 U/L (ref <=34)

## 2023-04-03 MED ORDER — VALACYCLOVIR 1 GRAM TABLET
ORAL_TABLET | Freq: Every day | ORAL | 11 refills | 30.00 days | Status: CP
Start: 2023-04-03 — End: ?
  Filled 2023-05-19: qty 30, 30d supply, fill #0

## 2023-04-03 MED ORDER — BIKTARVY 50 MG-200 MG-25 MG TABLET
ORAL_TABLET | Freq: Every day | ORAL | 3 refills | 90.00 days | Status: CP
Start: 2023-04-03 — End: ?
  Filled 2023-05-19: qty 90, 90d supply, fill #0

## 2023-04-03 MED ORDER — BENZONATATE 100 MG CAPSULE
ORAL_CAPSULE | 0 refills | 0.00 days | Status: CP
Start: 2023-04-03 — End: 2023-04-03

## 2023-04-03 NOTE — Unmapped (Signed)
 Social Work/Dietitian Control and instrumentation engineer Insecurity Screening:     I'm going to read you two statements that people have made about their food situation. For each statement, please tell me whether the statement was often true, sometimes true, or never true for your household in the last month.      1. ???We worried whether our food would run out before we got money to buy more.???   Yes     2. ???The food that we bought just didn't last, and we didn't have money to get more.  Yes    2. Does the patient receive SNAP (food stamps)?  Yes patient stated she has access to her food stamp card and is continuing to use it, however, DSS sent her a letter stating that she has to recertify her benefits. Patient stated,  I have a Haely Leyland bit of money left on the card. SW provided patient with information to local DSS office for follow up on recertification.  3. Does the patient access a food pantry?  No, SW provided patient with a list of food pantries in area in preferred language.    **IF patient answers NO to answers 2 and 3, provide patient with SNAP application and food pantries locations in their surrounding area**    Additional Questions:    1. Patient income level: <300% FPL    2. Has the patient's income changed since last RW application? stayed the same    3.  Has the patient completed RW enrollment paperwork: Yes      **If pt answer no to 3, have the patient speak with a RW benefit coordinator before they can use food pantry**    4. The patient is Eligible for the Food Pantry Program.       ** Joan Burns  has given pt food bag as means of emergency foods/last resort. All other methods to receive food were exhausted prior to receiving food bag.    [name], LCSWA  [place] [facility]

## 2023-04-04 NOTE — Unmapped (Signed)
 COVID Education:  Make sure you perform good hand washing (lasting 20 seconds), continue to social distance and limit close personal contact (which may include new sexual partners or having multiple partners during this period).  Try to isolate at home but please find ways to keep in touch with those close to you, such as meeting up with them electronically or socially distanced, and the ability to go outdoors alone or separated from others  If you become ill with fever, respiratory illness, sudden loss of taste and smell, stomach issues, diarrhea, nausea, vomiting - contact clinic for further instructions.  You should go to the emergency department if you develop systems such as shortness of breath, confusion, lightheadedness when standing, high fever.   Here is some information about HIV and CoVid vaccines: [email or web address]  If you're interested in receiving the CoVid vaccine when you're eligible, here are some resources for you to check and make an appointment:  Your local health department   [email or web address] through [facility]  [email or web address]  [facility] (if you are an established patient with them)  [email or web address]    URGENT CARE  Please call ahead to speak with the nursing staff if you are in need of an urgent appointment.       MEDICATIONS  For refills please contact your pharmacy and ask them to electronically send or fax the request to the clinic.   Please bring all medications in original bottles to every appointment.    HMAP (formerly ADAP) or [employer] Eligibility (required even if you do not receive medication through [facility])  Please remember to renew your Juanell Fairly eligibility during renewal periods which occur twice a year: January-March and July-September.     The following are needed for each renewal:   - The [facility] Identification (if you don't have one, then a bill with your name and address in [place])   - proof of income (award letter, W-2, or last three check stubs)   If you are unable to come in for renewal, let us know if we can mail, fax or e-mail paperwork to you.   HMAP Contact: [phone].     Lab info:  Your most recent CD4 T-cell counts and viral loads are below. Here are a few things to keep in mind when looking at your numbers:  Our goal is to get your virus to be undetectable and keep it undetectable. If the virus is undetectable you are much more likely to stay healthy.  We consider your viral load to be undetectable if it says <40 or if it says Not detected.  For most people, we're checking CD4 counts every other visit (once or twice a year, or sometimes even less).  It's normal for your CD4 count to be different from visit to visit.   You can help by taking your medications at about the same time, every single day. If you're having trouble with taking your medications, it's important to let us know.    Lab Results   Component Value Date    ACD4 130 (L) 04/03/2023    CD4 5 (L) 04/03/2023    HIVCP 55 (H) 10/02/2021    HIVRS Not Detected 01/02/2023        Please note that your laboratory and other results may be visible to you in real time, possibly before they reach your provider. Please allow 48 hours for clinical interpretation of these results. Importantly, even if a result is flagged as abnormal, it may not be one  that impacts your health.    It was nice to have a visit with you today!  Follow-up information:        Provider today:  Varney Daily, FNP-BC      ID CLINIC address:   [facility] Diseases [facility] at [facility]  [address]  [place], [place] [address]    Contact information:    The ID clinic phone number is [phone]   The ID clinic fax number is [phone]  For urgent issues on nights and weekends: Call the ID Physician on-call through the [facility] Operator at [phone].    Please sign up for My  Chart - This is a great way to review your labs and track your appointments    Please try to arrive 30 minutes BEFORE your scheduled appointment time!  This will give you time to fill out any front desk paperwork needed for your visit, and allow you to be seen as close to your scheduled appointment time as possible.

## 2023-04-05 LAB — HIV RNA, QUANTITATIVE, PCR
HIV RNA LOG10: 4.27 {Log_copies}/mL — ABNORMAL HIGH (ref <0.00)
HIV RNA QNT RSLT: DETECTED — AB
HIV RNA: 18608 {copies}/mL — ABNORMAL HIGH (ref <0)

## 2023-04-08 DIAGNOSIS — B20 Human immunodeficiency virus [HIV] disease: Principal | ICD-10-CM

## 2023-04-08 DIAGNOSIS — D849 Immunodeficiency, unspecified: Principal | ICD-10-CM

## 2023-04-08 MED ORDER — SULFAMETHOXAZOLE 800 MG-TRIMETHOPRIM 160 MG TABLET
ORAL_TABLET | Freq: Every day | ORAL | 1 refills | 90.00 days | Status: CP
Start: 2023-04-08 — End: ?
  Filled 2023-04-10: qty 30, 30d supply, fill #0

## 2023-04-08 NOTE — Unmapped (Signed)
 Contacted patient's daughter [name], who has been patient's contact for health related issues, via mobile. Informed [name] that patient's viral load is up to 18k. This would indicate either that patient is not taking medications or has had some sort of interruption. [name] recalls that while patient was ill with the flu recently that she had made a comment that she was sick of the medicine and stopped taking it. It's possible this is what happened. In looking at dispensing records for meds, patient should not have run out of supply. Patient declined to have a doctor's visit because she was concerned about having a large blood draw. I Stressed to [employer] that it would be important for [place] to draw blood to check viral load as it would give [place] some indication of whether or not to worry about resistance and would be important to know if patient is indeed taking ART. Patient has follow up appointment with Ali Lowe on 3/21. Will ask her to query patient about whether or not she is taking Biktarvy and the need for repeat viral load IF PATIENT IS TAKING ART. If not, needs to reinforce importance of taking ART and Bactrim DS. [name] will talk to patient as well about this.

## 2023-04-09 MED ORDER — ACCRUFER 30 MG CAPSULE
ORAL_CAPSULE | Freq: Two times a day (BID) | ORAL | 2 refills | 0 days
Start: 2023-04-09 — End: ?

## 2023-04-14 NOTE — Unmapped (Signed)
Patient did not show for today's behavioral health appointment. LCSW called patient in hopes of discussing this missed appointment and to reschedule, and patient did not answer.  Treatment is now discontinued until patient returns call.

## 2023-04-21 MED ORDER — METFORMIN 500 MG TABLET
ORAL_TABLET | Freq: Two times a day (BID) | ORAL | 1 refills | 90 days
Start: 2023-04-21 — End: ?

## 2023-04-21 MED ORDER — JARDIANCE 25 MG TABLET
ORAL_TABLET | ORAL | 1 refills | 90 days
Start: 2023-04-21 — End: ?

## 2023-04-21 MED ORDER — LANCETS
0 refills | 0 days
Start: 2023-04-21 — End: ?

## 2023-04-21 MED ORDER — ACCU-CHEK GUIDE TEST STRIPS
ORAL_STRIP | Freq: Two times a day (BID) | 1 refills | 50 days
Start: 2023-04-21 — End: ?

## 2023-04-21 MED ORDER — BLOOD-GLUCOSE METER
0 refills | 0 days
Start: 2023-04-21 — End: ?

## 2023-04-24 MED ORDER — NALTREXONE 50 MG TABLET
ORAL_TABLET | 0 refills | 0 days
Start: 2023-04-24 — End: ?

## 2023-05-01 DIAGNOSIS — E119 Type 2 diabetes mellitus without complications: Principal | ICD-10-CM

## 2023-05-07 MED FILL — ACCU-CHEK GUIDE GLUCOSE METER: 30 days supply | Qty: 1 | Fill #0

## 2023-05-07 MED FILL — ACCRUFER 30 MG CAPSULE: ORAL | 30 days supply | Qty: 60 | Fill #0

## 2023-05-07 MED FILL — ACCU-CHEK SOFTCLIX LANCETS: 30 days supply | Qty: 100 | Fill #0

## 2023-05-07 MED FILL — METFORMIN 500 MG TABLET: ORAL | 90 days supply | Qty: 180 | Fill #0

## 2023-05-07 MED FILL — NALTREXONE 50 MG TABLET: 30 days supply | Qty: 90 | Fill #0

## 2023-05-07 MED FILL — ACCU-CHEK GUIDE TEST STRIPS: 25 days supply | Qty: 50 | Fill #0

## 2023-05-09 MED FILL — JARDIANCE 25 MG TABLET: ORAL | 90 days supply | Qty: 90 | Fill #0

## 2023-05-13 ENCOUNTER — Ambulatory Visit: Admit: 2023-05-13 | Payer: Medicaid (Managed Care) | Attending: Family | Primary: Family

## 2023-05-13 NOTE — Unmapped (Unsigned)
 INFECTIOUS DISEASES CLINIC  [address]  [place], [place]  [address]  P [phone]  F [phone]     Primary care provider: Gradie Lawless, FNP    Assessment/Plan:      [name] primary contact is still Denese Finn (daughter). She schedules patient's Medicaid transport and manages her medical care. This may change down the road but [name] and patient firm that she is still main contact.    HIV (dx'd 2015, nadir CD4 76/4% in 07/20/20)  - chronic, severe  Patient diagnosed with HIV in 2015 as part of pre-op work up for myomectomy. Per report from daughter, patient had declined treatment and her daughters through the years had tried to get her into care but was unable to, both here in [place] and in [place]. They were unable to establish care for her in [place] because her Medicaid was in .    Patient speaks Tshiluba or Luba-Kasai, a [place] language, that can be accessed by [employer] (via telephone [phone]) if an appointment is made with them. Patient actually speaks a combination of Swahili and Tshiluba but prefers interpretation in Tshiluba/Luba-K[place]ai. [place] interpreter can be used if Tsiluba/Luba-kasai interpreter cannot be found. Does not prefer Swahili.    [place] interpreter used which was patient's preference. Patient is unaccompanied. Discussed care with [name] after the visit today.    Overall doing well. Current regimen: Biktarvy  (BIC/FTC/TAF)  Misses doses of ARVs never     Med access via Medicaid  CD4 count  102/6% after starting Biktarvy  , on Bactrim  SS daily. Repeat CD4 today. Will stop Bactrim  if continues to be virally suppressed.  Discussed ARV adherence and taking ARVs with food    Lab Results   Component Value Date    ACD4 130 (L) 04/03/2023    CD4 5 (L) 04/03/2023    HIVRS Detected (A) 04/03/2023    HIVCP 18,608 (H) 04/03/2023     CD4, HIV RNA, and safety labs (full return panel)   Continue current therapy On Biktarvy  and Bactrim  SS, can stop Bactrim  today (04/04/23) Discussed with Dr. Geargin Wilson who agrees with plan.   Discussed importance of ARV adherence      Right breast mass, incidentally found - chronic  Patient had not reported breast mass at previous visits though according to patient and daughter this was a mass present for some years. Patient reports size is stable. No LAD on exam.  Obtain chest x-ray - unremarkable results today  Obtain diagnostic bilateral mammogram with ultrasound if needed. - Appointment made for 02/11/23, soonest available. [name] (daughter) is planning to come to [place] so that she can accompany patient to this visit.  02/11/23 mass imaged. 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  Reports having malaria treatment x 3 months with oral medication. Followed up with doctor in [place] who said she was cured.  Had joint pain with infection, now resolved.  No symptoms since treatment.  Discussed with Dr. Donna Fus who does not recommend any medications at this time but if she becomes symptomatic - malaise, fatigue, fevers, chills, should order [id number] - Malaria exam, peripheral blood.      HIV retinopathy  -  chronic    Diagnosed by Ophthalmology, Dr. Cornelius Dill  Incidental finding of CWS when patient being evaluated for cataracts.  Daughter reports that  patient has not complained of vision changes prior to this diagnosis.  Continue close follow up with Ophthalmology, next appointment due 06/2021 but has not followed up - still needs to schedule      +HepC Ab/HCV RNA not detected (03/2020)  Consider baseline ultrasound.   Confirm no treatment for Hep C  01/02/23 Hep C RNA not detected      HSV-2 infection/Perianal lesions  -  resolved  Reports that lesion started by itching, which became a vesicle and then because of itching, the lesions opened. Watery, clear discharge has been draining since then, leasions seems to be getting bigger. Not experiencing pain, but is itchy.   07/11/20 Evaluated by PCP with surface swab revealing mixed gram positive organisms. Started on Bactrim  SS BID.  Dr. Vella Gey in to evaluate patient along with myself. Skin erosions are characteristic of HSV infection that may also be superinfected with bacteria.  Patient given Valtrex  1000mg  daily for HSV suppression. Refills x 1 year      History of CoVid infection (dx'd 09/25/21) - treated  Evaluated at [facility] with the following symptoms: congestion, sinus pressure, nonproductive cough, and some chills.   Prescribed molnupiravir and tessalon  perles- took the medications      Uterine leiomyoma - chronic  Had uterine surgery in 2014 per patient report  Seen by GYN 12/27/20 and no further evaluation needed at this time other than routine well woman exam.       Hypothyroidism - chronic, stable  Managed by PCP      Alcohol use disorder - chronic - not addressed today  Drinks 5 beers or wine a day  Since being on antibiotics, patient drinks 1-2 drinks daily.    Right knee pain - chronic, exacerbated - did not address at this visit  Patient has chronic right knee complaints, evaluated by ED for it on 09/25/21 (advised to see PCP about this) and Dr. Danetta Dunnings with [facility] on 10/02/21.   US  PVL ordered for patient and has appointment 10/31/21.  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  Right knee today is slightly swollen and tender somewhat to touch though no signs of infection. Has pain with opposing forces to RLE.  At last visit with Dr. Danetta Dunnings, imaging ordered with possibility for knee injection.  On 11/02/21, seen for knee pain. Given orders for PT and IBU. Suspected autoimmune disorder.   Patient continues to have bilateral knee pain, unable to address with competing priorities.  Needs to schedule PT.    Reported history of Bell's Palsy and Shingles outbreak    Sexual health & secondary prevention  -  abstinent  Not in relationship. Last sexual encounter >5 years ago, remains abstinent  She  is abstinent  She does not routinely discuss HIV status with partner(s).  Has children and post menopausal .    Lab Results   Component Value Date    RPR Nonreactive 01/02/2023    RPR Nonreactive 07/20/2020     GC/CT NAATs - not being checked routinely for this patient  RPR - for screening obtained today      Health maintenance  - chronic, acutely exacerbated  PCP: Ike Malady, FNP/ Dr. Danetta Dunnings  Elevated A1C and lower H/H, has appointment 04/11/23    Oral health  She  unknown  have a dentist. Last dental exam unknown.    Eye health  She does  use corrective lenses. Last eye exam followed by Opthalmology.    Metabolic conditions  Wt Readings from Last 5 Encounters:   04/03/23 76.6 kg (168 lb 12.8 oz)   01/21/23 80.2 kg (176 lb 14.4 oz)   01/02/23 80.1 kg (176 lb 9.6 oz)   12/26/22 78.9 kg (174 lb)   11/02/21 71.7 kg (158 lb)     Lab Results   Component Value Date    CREATININE 0.65 04/03/2023    GLUCOSEU Trace (A) 01/02/2023    ALBCRERAT 2.0 01/02/2023    GLU 155 04/03/2023    A1C 8.6 (H) 04/03/2023    ALT 11 04/03/2023    ALT 11 01/02/2023    ALT 11 10/30/2021     # Kidney health - SCr and eGFR   # Bone health - defer mgm't to PCP, on Fosamax   # Diabetes assessment - random glucose today  # NAFLD assessment - monitor over time    Communicable diseases  Lab Results   Component Value Date    QFTTBGOLD Negative 06/29/2020    HEPAIGG Reactive (A) 07/20/2020    HEPBSAB Reactive (A) 07/20/2020    HEPCAB Reactive (A) 04/14/2020    HCVRNA Not Detected 01/02/2023     # TB screening - no longer needed; negative IGRA, low risk  # Hepatitis screening -  as noted:  See above, Hep A/B IMMUNE  # MMR screening - not assessed    Cancer screening  No results found for: PSASCRN, PSA, PAP, FINALDX  # Cervical -  Defer management to GYN  # Breast - repeat mammogram 35m from prior    # Anorectal -  will disucss  # Colorectal -  Defer to PCP  # Liver - no screening indicated  # Lung - screening not indicated    Cardiovascular disease  Lab Results   Component Value Date    CHOL 173 04/03/2023    HDL 39 (L) 04/03/2023    LDL 109 (H) 04/03/2023    NONHDL 134 (H) 04/03/2023    TRIG 125 04/03/2023     # The 10-year ASCVD risk score (Arnett DK, et al., 2019) is: 16%  - is not taking aspirin   - is not taking statin  - BP control good  - never smoker  # AAA screening - no indication for screening    Immunization History   Administered Date(s) Administered    COVID-19 VACCINE,MRNA(MODERNA)(PF) 10/06/2020, 11/03/2020    Influenza Vaccine Quad(IM)6 MO-Adult(PF) 10/30/2021     Immunizations- needs PCV-21  Patient received primary CoVid series and reports getting subsequent CoVid boosters through her work place.- patient currently not working    I personally spent 35 minutes face-to-face and non-face-to-face in the care of this patient, which includes all pre, intra, and post visit time on the date of service.   Time for translation extended out our discussions.      Disposition  Next appointment: 3 months       To do @ next RTC  Discuss anal pap.  Alcohol history  Consider abdominal ultrasound  PCV-21      Lindi Revering, FNP-BC  [facility] Infect[facility] Diseases [facility] at [facility]  [address], [place], [place] [address]    Phone: [phone]   Fax: [phone]             Subjective      Chief Complaint   HIV follow up    HPI  In addition to details in A&P  above:    Daughter lives in [place] at this time and will travel back and forth occasionally to help with health care for patient. Patient has 3 daughters but Denese Finn is her primary caregiver out of her daughters.  Denies any fever, chills, nausea, vomiting, rash, urinary complaints, diarrhea  Feeling good overall, denies any side effects from medications  Has knee pain as above.    12/26/22  Was in [place] for 8 months - had supply of medications  Had malaria while she was there, had malaria treatment while she was there. Occurred about 6 months before she returned to the [place] . Came back in May 2024.  Denies any fever, chills, nausea, vomiting, rash, urinary complaints, diarrhea, constipation.  Has put on 14 pounds since last visit.  Pain in chest, difficulty breathing she thinks from gaining weight. Sharp poking pain midsternally, noise with breathing. Happens at rest when sleeping, laying down, wakes her up, lasts about 10-15 minutes, drinks water and then restarts, can't sleep well. Happens every 3rd week. Last happened last Friday. Started 2 months ago.  Bilateral knees and back hurts.  [name] is having a lot of health problems right now. She is trying to see if her sister in [place] can help her mom but for now she is the main contact for care of her mother. Patient agrees.    04/03/23  Received a letter about rent. Not sure what is happening with April rent. Has met with Child psychotherapist.  Problems with SNAP benefits - SW in to see patient today  In ED 03/02/23 seen for flu. Sent to breast clinic by [facility].  Has had insomnia since having fever several weeks ago. Stays awake until 1 or 2 in the morning and has difficulty sleeping at night. Wakes up around 9 or 10 am. Spoke to [place] about this, she reports that another provider recommended using melatonin but it has not been bought by family helping mom at this time. She believes mom is sleeping well but is bored by not working so thinks she does not sleep that long.  Has nausea in the morning since having the fever. No vomiting. Has had some cough. During visit, patient reported that cough was keeping her up at night but patient did not cough during visit and daughter reports that patient is not coughing much and is able to sleep. I canceled tessalon  perles script.    Past Medical History:   Diagnosis Date    Dermatitis 04/14/2020    Herpes simplex infection of perianal skin     HIV disease     Other osteoporosis without current pathological fracture     Other specified hypothyroidism     Pressure injury of buttock, stage 2 (CMS-HCC) 07/11/2020       Social History  Background - From [place]. No longer works. Currently lives in [place], [place].    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 All others negative.      Medications and Allergies  She has a current medication list which includes the following prescription(s): alendronate , biktarvy , accu-chek guide  test strips, blood-glucose meter, calcium carbonate-vitamin d3, cetirizine, jardiance , jardiance , accrufer , fluticasone propionate, lagevrio (eua), lancets, levothyroxine , metformin , naltrexone , polyvinyl alcohol/povidone/pf, sulfamethoxazole -trimethoprim , and valacyclovir .    Allergies: Tetanus-diphtheria toxoids-td      Family History  Her family history is not on file.          Objective:      There were no vitals taken for this visit.  Wt Readings from Last 3 Encounters:   04/03/23 76.6 kg (168 lb 12.8 oz)   01/21/23 80.2 kg (176 lb 14.4 oz)   01/02/23 80.1 kg (176 lb 9.6 oz)       Const looks well and attentive, alert, appropriate   Eyes sclerae anicteric, noninjected OU   ENT no thrush, leukoplakia or oral lesions   Lymph no cervical or supraclavicular LAD   CV RRR. No murmurs. No rub or gallop. S1/S2.   Lungs CTAB ant/post, normal work of breathing   GI Soft, no organomegaly. NTND. NABS.   GU deferred   Rectal deferred   Skin no petechiae, ecchymoses or obvious rashes on clothed exam   MSK normal ROM throughout       Psych Appropriate affect. Eye contact good. Linear thoughts. Fluent speech.     Laboratory Data  Reviewed in Epic today, using Synopsis and Chart Review filters.    Lab Results   Component Value Date    CREATININE 0.65 04/03/2023    QFTTBGOLD Negative 06/29/2020    HEPCAB Reactive (A) 04/14/2020    HCVRNA Not Detected 01/02/2023    CHOL 173 04/03/2023    HDL 39 (L) 04/03/2023    LDL 109 (H) 04/03/2023    NONHDL 134 (H) 04/03/2023    TRIG 125 04/03/2023    A1C 8.6 (H) 04/03/2023

## 2023-05-14 MED ORDER — LEVOTHYROXINE 125 MCG TABLET
ORAL_TABLET | Freq: Every day | ORAL | 0 refills | 90.00 days
Start: 2023-05-14 — End: 2024-05-13

## 2023-05-14 NOTE — Unmapped (Addendum)
 I reviewed this patient case and all documentation provided by the learner and was readily available for consultation during their interaction with the patient.  I agree with the assessment and plan listed below.    Joan Burns, PharmD   [facility] Delivery [facility] Specialty Pharmacist    [facility] Specialty [facility] Home Delivery Pharm[facility] Refill Coordination Note    Specialty Medication(s) to be Shipped:   Infectious Disease: Biktarvy     Other medication(s) to be shipped:  bactrim , alendronate , valtrex , synthroid      Joan Burns, DOB: 10-Oct-1950  Phone: [phone] (home)       All above HIPAA information was verified with patient's family member, Joan Burns.     Was a Nurse, learning disability used for this call? No    Completed refill call assessment today to schedule patient's medication shipment from the [facility]  [phone]).  All relevant notes have been reviewed.     Specialty medication(s) and dose(s) confirmed: Regimen is correct and unchanged.   Changes to medications: [name] reports no changes at this time.  Changes to insurance: No  New side effects reported not previously addressed with a pharmacist or physician: Yes - Patient reports swelling of one side of face. Patient would like to speak to the pharmacist today. Their provider is not aware.  Questions for the pharmacist:  Please see other note       Confirmed patient received a Conservation officer, historic buildings and a Surveyor, mining with first shipment. The patient will receive a drug information handout for each medication shipped and additional FDA Medication Guides as required.       DISEASE/MEDICATION-SPECIFIC INFORMATION        N/A    SPECIALTY MEDICATION ADHERENCE     Medication Adherence    Patient reported X missed doses in the last month: 2  Specialty Medication: Biktarvy   Informant: other relative  Confirmed plan for next specialty medication refill: delivery by pharmacy  Refills needed for supportive medications: not needed          Refill Coordination    Has the Patients' Contact Information Changed: No  Is the Shipping Address Different: No         Were doses missed due to medication being on hold? No    Biktarvy  50-200-25 mg: 7 days of medicine on hand       REFERRAL TO PHARMACIST     Referral to the pharmacist:  Please see other note 05/14/2023.      SHIPPING     Shipping address confirmed in Epic.     Cost and Payment: Patient has a copay of $16. They are aware and have authorized the pharmacy to charge the credit card on file.    Delivery Scheduled: Yes, Expected medication delivery date: 4/29.     Medication will be delivered via UPS to the temporary address in Epic WAM.    Joan Burns   [facility] Delivery [facility]  Specialty Pharmacist

## 2023-05-14 NOTE — Unmapped (Addendum)
 I reviewed this patient case and all documentation provided by the learner and was readily available for consultation during their interaction with the patient.  I agree with the assessment and plan listed below.    Riesa Chary, PharmD   [facility] Delivery [facility] Specialty Pharmacist    [facility] Delivery [facility] - Clinical Pharmacist Intervention   Reason for Intervention Issue Observed Disease state management          Additional Comments New swelling of the face   Intervention Type of Intervention Provider contacted                  Recommendation provided, if applicable      Medication Medication(s) Involved Biktarvy    Outcome Expected Result/Outcome of Intervention Improved therapy effectiveness  Enhanced patient understanding    Additional Comments      Follow-up Needed? No    Additional Follow-up Comments     Supporting Information Approximate Time Spent on Intervention 0-10 minutes    Clinical Evidence Used Professional judgement       [name], PharmD  [facility] Special[facility] and Home Delivery Pharm[facility] Pharmacist

## 2023-05-15 MED ORDER — LEVOTHYROXINE 125 MCG TABLET
ORAL_TABLET | Freq: Every day | ORAL | 2 refills | 90.00 days | Status: CP
Start: 2023-05-15 — End: 2024-05-14
  Filled 2023-05-19: qty 90, 90d supply, fill #0

## 2023-05-19 ENCOUNTER — Inpatient Hospital Stay: Attending: Hematology and Oncology | Admitting: Hematology and Oncology

## 2023-05-19 MED FILL — SULFAMETHOXAZOLE 800 MG-TRIMETHOPRIM 160 MG TABLET: ORAL | 30 days supply | Qty: 30 | Fill #1

## 2023-05-19 MED FILL — ALENDRONATE 70 MG TABLET: ORAL | 84 days supply | Qty: 12 | Fill #1

## 2023-06-30 ENCOUNTER — Inpatient Hospital Stay: Attending: Hematology and Oncology | Admitting: Hematology and Oncology

## 2023-08-01 DIAGNOSIS — M810 Age-related osteoporosis without current pathological fracture: Principal | ICD-10-CM

## 2023-08-01 MED ORDER — ALENDRONATE 70 MG TABLET
ORAL_TABLET | ORAL | 1 refills | 84.00000 days | Status: CP
Start: 2023-08-01 — End: 2024-07-31
  Filled 2023-08-14: qty 12, 84d supply, fill #0

## 2023-08-01 NOTE — Unmapped (Signed)
 Patient is requesting the following refill  Requested Prescriptions     Pending Prescriptions Disp Refills    alendronate  (FOSAMAX ) 70 MG tablet 12 tablet 1     Sig: Take 1 tablet (70 mg total) by mouth every seven (7) days.       Recent Visits  Date Type Provider Dept   01/21/23 Office Visit Gayle, Verneita Molt, FNP [facility] [address] [facility]   Showing recent visits within past 365 days and meeting all other requirements  Future Appointments  No visits were found meeting these conditions.  Showing future appointments within next 365 days and meeting all other requirements       Labs: Not applicable this refill

## 2023-08-04 ENCOUNTER — Encounter: Admit: 2023-08-04 | Discharge: 2023-08-04 | Payer: Medicaid (Managed Care) | Attending: Family | Primary: Family

## 2023-08-04 DIAGNOSIS — B349 Viral infection, unspecified: Principal | ICD-10-CM

## 2023-08-04 DIAGNOSIS — B20 Human immunodeficiency virus [HIV] disease: Principal | ICD-10-CM

## 2023-08-04 DIAGNOSIS — Z1633 Resistance to antiviral drug(s): Principal | ICD-10-CM

## 2023-08-04 DIAGNOSIS — B009 Herpesviral infection, unspecified: Principal | ICD-10-CM

## 2023-08-04 DIAGNOSIS — Z9189 Other specified personal risk factors, not elsewhere classified: Principal | ICD-10-CM

## 2023-08-04 DIAGNOSIS — D849 Immunodeficiency, unspecified: Principal | ICD-10-CM

## 2023-08-04 DIAGNOSIS — Z5181 Encounter for therapeutic drug level monitoring: Principal | ICD-10-CM

## 2023-08-04 DIAGNOSIS — Z79899 Other long term (current) drug therapy: Principal | ICD-10-CM

## 2023-08-04 DIAGNOSIS — R739 Hyperglycemia, unspecified: Principal | ICD-10-CM

## 2023-08-04 LAB — CBC W/ AUTO DIFF
BASOPHILS ABSOLUTE COUNT: 0 10*9/L (ref 0.0–0.1)
BASOPHILS RELATIVE PERCENT: 0.2 %
EOSINOPHILS ABSOLUTE COUNT: 0.3 10*9/L (ref 0.0–0.5)
EOSINOPHILS RELATIVE PERCENT: 5.7 %
HEMATOCRIT: 27.3 % — ABNORMAL LOW (ref 34.0–44.0)
HEMOGLOBIN: 8.6 g/dL — ABNORMAL LOW (ref 11.3–14.9)
LYMPHOCYTES ABSOLUTE COUNT: 2.2 10*9/L (ref 1.1–3.6)
LYMPHOCYTES RELATIVE PERCENT: 41.5 %
MEAN CORPUSCULAR HEMOGLOBIN CONC: 31.3 g/dL — ABNORMAL LOW (ref 32.0–36.0)
MEAN CORPUSCULAR HEMOGLOBIN: 18.9 pg — ABNORMAL LOW (ref 25.9–32.4)
MEAN CORPUSCULAR VOLUME: 60.4 fL — ABNORMAL LOW (ref 77.6–95.7)
MEAN PLATELET VOLUME: 8.9 fL (ref 6.8–10.7)
MONOCYTES ABSOLUTE COUNT: 0.4 10*9/L (ref 0.3–0.8)
MONOCYTES RELATIVE PERCENT: 7.9 %
NEUTROPHILS ABSOLUTE COUNT: 2.4 10*9/L (ref 1.8–7.8)
NEUTROPHILS RELATIVE PERCENT: 44.7 %
PLATELET COUNT: 298 10*9/L (ref 150–450)
RED BLOOD CELL COUNT: 4.53 10*12/L (ref 3.95–5.13)
RED CELL DISTRIBUTION WIDTH: 21 % — ABNORMAL HIGH (ref 12.2–15.2)
WBC ADJUSTED: 5.4 10*9/L (ref 3.6–11.2)

## 2023-08-04 LAB — HIV RNA, QUANTITATIVE, PCR
HIV RNA LOG10: 2.38 {Log_copies}/mL — ABNORMAL HIGH (ref <0.00)
HIV RNA QNT RSLT: DETECTED — AB
HIV RNA: 241 {copies}/mL — ABNORMAL HIGH (ref <0)

## 2023-08-04 LAB — BASIC METABOLIC PANEL
ANION GAP: 5 mmol/L (ref 5–14)
BLOOD UREA NITROGEN: 13 mg/dL (ref 9–23)
BUN / CREAT RATIO: 19
CALCIUM: 8.9 mg/dL (ref 8.7–10.4)
CHLORIDE: 103 mmol/L (ref 98–107)
CO2: 28.7 mmol/L (ref 20.0–31.0)
CREATININE: 0.67 mg/dL (ref 0.55–1.02)
EGFR CKD-EPI (2021) FEMALE: >90 mL/min/1.73m2 (ref >=60)
GLUCOSE RANDOM: 169 mg/dL (ref 70–179)
POTASSIUM: 4.3 mmol/L (ref 3.4–4.8)
SODIUM: 137 mmol/L (ref 135–145)

## 2023-08-04 LAB — SLIDE REVIEW

## 2023-08-04 LAB — AST: AST (SGOT): 17 U/L (ref <=34)

## 2023-08-04 LAB — LYMPH MARKER LIMITED,FLOW
ABSOLUTE CD3 CNT: 2068 {cells}/uL (ref 915–3400)
ABSOLUTE CD4 CNT: 110 {cells}/uL — ABNORMAL LOW (ref 510–2320)
ABSOLUTE CD8 CNT: 1914 {cells}/uL — ABNORMAL HIGH (ref 180–1520)
CD3% (T CELLS): 94 % — ABNORMAL HIGH (ref 61–86)
CD4% (T HELPER): 5 % — ABNORMAL LOW (ref 34–58)
CD4:CD8 RATIO: 0.1 — ABNORMAL LOW (ref 0.9–4.8)
CD8% T SUPPRESR: 87 % — ABNORMAL HIGH (ref 12–38)

## 2023-08-04 LAB — ALT: ALT (SGPT): 11 U/L (ref 10–49)

## 2023-08-04 LAB — HEMOGLOBIN A1C
ESTIMATED AVERAGE GLUCOSE: 200 mg/dL
HEMOGLOBIN A1C: 8.6 % — ABNORMAL HIGH (ref 4.8–5.6)

## 2023-08-04 LAB — BILIRUBIN, TOTAL: BILIRUBIN TOTAL: 0.4 mg/dL (ref 0.3–1.2)

## 2023-08-04 MED ORDER — SULFAMETHOXAZOLE 400 MG-TRIMETHOPRIM 80 MG TABLET
ORAL_TABLET | Freq: Every day | ORAL | 5 refills | 30.00000 days | Status: CP
Start: 2023-08-04 — End: ?

## 2023-08-04 MED ORDER — SULFAMETHOXAZOLE 800 MG-TRIMETHOPRIM 160 MG TABLET
ORAL_TABLET | Freq: Every day | ORAL | 1 refills | 90.00000 days | Status: CP
Start: 2023-08-04 — End: 2023-08-04
  Filled 2023-08-14: qty 30, 30d supply, fill #0

## 2023-08-04 MED ORDER — VALACYCLOVIR 1 GRAM TABLET
ORAL_TABLET | Freq: Every day | ORAL | 3 refills | 90.00000 days | Status: CP
Start: 2023-08-04 — End: ?
  Filled 2023-08-14: qty 30, 30d supply, fill #0

## 2023-08-04 MED ORDER — BIKTARVY 50 MG-200 MG-25 MG TABLET
ORAL_TABLET | Freq: Every day | ORAL | 3 refills | 90.00000 days | Status: CP
Start: 2023-08-04 — End: ?
  Filled 2023-08-11: qty 90, 90d supply, fill #0

## 2023-08-04 NOTE — Unmapped (Signed)
 INFECTIOUS DISEASES CLINIC  [address]  [place], [place]  [address]  P [phone]  F [phone]     Primary care provider: Gayle Verneita Molt, FNP    Assessment/Plan:      [name] primary contact is still Joan Burns (daughter). She schedules patient's Medicaid transport and manages her medical care. This may change down the road but Joan Burns and patient firm that she is still main contact.    HIV (dx'd 2015, nadir CD4 76/4% in 07/20/20)  - chronic, severe  Patient diagnosed with HIV in 2015 as part of pre-op work up for myomectomy. Per report from daughter, patient had declined treatment and her daughters through the years had tried to get her into care but was unable to, both here in [place] and in [place]. They were unable to establish care for her in [place] because her Medicaid was in [place].    Patient speaks Tshiluba or Luba-Kasai, a [place] language, that can be accessed by [employer] (via telephone [phone]) if an appointment is made with them. Patient actually speaks a combination of Swahili and Tshiluba but prefers interpretation in Tshiluba/Luba-Kasai. [place] interpreter can be used if Tsiluba/Luba-kasai interpreter cannot be found. Does not prefer Swahili.    [place] interpreter used which was patient's preference. Patient is unaccompanied. Discussed care with Joan Burns after the visit today.    Overall doing well. Current regimen: Biktarvy  (BIC/FTC/TAF)  Misses doses of ARVs never     Med access via Medicaid  CD4 count 102/6% after starting Biktarvy , on Bactrim  SS daily. Repeat CD4 today. Will stop Bactrim  if continues to be virally suppressed.  Discussed ARV adherence and taking ARVs with food    Lab Results   Component Value Date    ACD4 110 (L) 08/04/2023    CD4 5 (L) 08/04/2023    HIVRS Detected (A) 08/04/2023    HIVCP 241 (H) 08/04/2023     CD4, HIV RNA, and safety labs (full return panel) and genotyping (PR, RT, and IN)   Continue current therapy On Biktarvy  and Bactrim  SS, restart Bactrim  as patient is not virally suppressed at this time so will need opportunistic infection prevention.  Discussed importance of ARV adherence  Patient firm on not missing Biktarvy  doses while abroad. Rapid increase in viral load at last visit hard to explain otherwise. Patient had received IV infusions for malaria while in [place], it is possible that whatever was in infusion might have interfered with medication. In this case, there is some concern for resistance developing, will obtain genotype today in case patient continues to be viremic.  Patient does not like blood draws/needles which has led her to canceling her health appointments. I have drawn labs today in hopes that PCP will not require testing at this upcoming visit.  Assessment & Plan  Diabetes mellitus - new  Elevated A1c with recent initiation of diabetes medication. Follow-up pending.  - Ensure adherence to diabetes medication.  - Schedule follow-up for diabetes management with PCP.    HSV-2 infection/Perianal lesions  - resolved  Reports that lesion started by itching, which became a vesicle and then because of itching, the lesions opened. Watery, clear discharge has been draining since then, leasions seems to be getting bigger. Not experiencing pain, but is itchy.   07/11/20 Evaluated by PCP with surface swab revealing mixed gram positive organisms. Started on Bactrim  SS BID.  Dr. JOHNITA Burns in to evaluate patient along with myself. Skin erosions are characteristic of HSV infection that may also  be superinfected with bacteria.  Patient given Valtrex  1000mg  daily for HSV suppression. Refills x 1 year    HIV retinopathy  - chronic   Diagnosed by Ophthalmology, Dr. Bambi  Incidental finding of CWS when patient being evaluated for cataracts.  Daughter reports that patient has not complained of vision changes prior to this diagnosis.  Continue close follow up with Ophthalmology, next appointment due 06/2021 but has not followed up - still needs to schedule    Right breast mass, incidentally found - chronic  Patient had not reported breast mass at previous visits though according to patient and daughter this was a mass present for some years. Patient reports size is stable. No LAD on exam.  Obtain chest x-ray - unremarkable results today  Obtain diagnostic bilateral mammogram with ultrasound if needed. - Appointment made for 02/11/23, soonest available. Joan Burns (daughter) is planning to come to [place] so that she can accompany patient to this visit.  02/11/23 mass imaged. BIRADS-2 repeat in 01/2024, US  shows no malignancy  Was seen at [facility] ED and referred to Breast Clinic    Malaria infection (by patient report) (12/2021)  Infected and treated while in [place].  Reports having malaria treatment x 3 months with oral medication. Followed up with doctor in [place] who said she was cured.  Had joint pain with infection, now resolved.  No symptoms since treatment.  Discussed with Dr. DOROTHA Burns who does not recommend any medications at this time but if she becomes symptomatic - malaise, fatigue, fevers, chills, should order [id number] - Malaria exam, peripheral blood.    +HepC Ab/HCV RNA not detected (03/2020)  Consider baseline ultrasound.   Confirm no treatment for Hep C  01/02/23 Hep C RNA not detected    Uterine leiomyoma - chronic  Had uterine surgery in 2014 per patient report  Seen by GYN 12/27/20 and no further evaluation needed at this time other than routine well woman exam.     Hypothyroidism - chronic, stable  Managed by PCP    Alcohol use disorder - chronic - not addressed today  Drinks 5 beers or wine a day  Since being on antibiotics, patient drinks 1-2 drinks daily.    Right knee pain - chronic, exacerbated - did not address at this visit  Patient has chronic right knee complaints, evaluated by ED for it on 09/25/21 (advised to see PCP about this) and Dr. Keven with [facility] on 10/02/21.   US  PVL ordered for patient and has appointment 10/31/21.  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  Right knee today is slightly swollen and tender somewhat to touch though no signs of infection. Has pain with opposing forces to RLE.  At last visit with Dr. Keven, imaging ordered with possibility for knee injection.  On 11/02/21, seen for knee pain. Given orders for PT and IBU. Suspected autoimmune disorder.   Patient continues to have bilateral knee pain, unable to address with competing priorities.  Needs to schedule PT.    Reported history of Bell's Palsy and Shingles outbreak    Sexual health & secondary prevention  - abstinent  Not in relationship. Last sexual encounter >5 years ago, remains abstinent  She is abstinent  She does not routinely discuss HIV status with partner(s).  Has children and post menopausal.    Lab Results   Component Value Date    RPR Nonreactive 01/02/2023  RPR Nonreactive 07/20/2020     GC/CT NAATs - patient declined screening  RPR - for screening obtained today      Health maintenance  - chronic, acutely exacerbated  PCP: Verneita Sacks, FNP/ Dr. Keven  Elevated A1C and lower H/H, has appointment 04/11/23    Oral health  She does not have a dentist. Last dental exam for some years. 08/06/2023 - referral placed    Eye health  She does  use corrective lenses. Last eye exam followed by Opthalmology. 08/06/2023 - reminded daughter to arrange for follow up due to HIV retinopathy but also with new diabetes diagnosis.      Metabolic conditions  Wt Readings from Last 5 Encounters:   08/04/23 77.7 kg (171 lb 3.2 oz)   04/03/23 76.6 kg (168 lb 12.8 oz)   01/21/23 80.2 kg (176 lb 14.4 oz)   01/02/23 80.1 kg (176 lb 9.6 oz)   12/26/22 78.9 kg (174 lb)     Lab Results   Component Value Date    CREATININE 0.67 08/04/2023    GLUCOSEU Trace (A) 01/02/2023    ALBCRERAT 2.0 01/02/2023    GLU 169 08/04/2023    A1C 8.6 (H) 08/04/2023    ALT 11 08/04/2023    ALT 11 04/03/2023    ALT 11 01/02/2023     # Kidney health - SCr and eGFR   # Bone health - defer mgm't to PCP, on Fosamax   # Diabetes assessment - random glucose today  # NAFLD assessment - monitor over time    Communicable diseases  Lab Results   Component Value Date    QFTTBGOLD Negative 06/29/2020    HEPAIGG Reactive (A) 07/20/2020    HEPBSAB Reactive (A) 07/20/2020    HEPCAB Reactive (A) 04/14/2020    HCVRNA Not Detected 01/02/2023     # TB screening - no longer needed; negative IGRA, low risk  # Hepatitis screening - as noted: See above, Hep A/B IMMUNE  # MMR screening - not assessed    Cancer screening  No results found for: PSASCRN, PSA, PAP, FINALDX  # Cervical - Defer management to GYN  # Breast - repeat mammogram 80m from prior    # Anorectal - will disucss  # Colorectal - Defer to PCP  # Liver - no screening indicated  # Lung - screening not indicated    Cardiovascular disease  Lab Results   Component Value Date    CHOL 173 04/03/2023    HDL 39 (L) 04/03/2023    LDL 109 (H) 04/03/2023    NONHDL 134 (H) 04/03/2023    TRIG 125 04/03/2023     # The 10-year ASCVD risk score (Arnett DK, et al., 2019) is: 28%  - is not taking aspirin   - is not taking statin  - BP control good  - never smoker  # AAA screening - no indication for screening    Immunization History   Administered Date(s) Administered    COVID-19 VACCINE,MRNA(MODERNA)(PF) 10/06/2020, 11/03/2020    Influenza Vaccine Quad(IM)6 MO-Adult(PF) 10/30/2021     Immunizations- needs PCV-21, will await next check of CD4 count.  Patient received primary CoVid series and reports getting subsequent CoVid boosters through her work place.- patient currently not working    I personally spent 35 minutes face-to-face and non-face-to-face in the care of this patient, which includes all pre, intra, and post visit time on the date of service.   Time for translation extended out our discussions.  Disposition  Next appointment: 4 weeks       To do @ next RTC  Discuss anal pap.  Alcohol history  Consider abdominal ultrasound  PCV-21      Delvin Gunner, FNP-BC  [facility] Infectious Diseases Clinic at [facility]  [address] [place], [place] [address]    Phone: [phone]   Fax: [phone]             Subjective      Chief Complaint   HIV follow up    HPI  In addition to details in A&P above:  History of Present Illness  08/04/2023  Joan Burns is a 73 year old female with HIV who presents for HIV follow-up due to concerns about increased viral load. She is accompanied by her daughter, Joan Burns.    Her viral load was previously high, decreased with medication adherence, but has recently increased again. She denies stopping her medication and is unsure why the viral load has increased. No symptoms of infectious disease, including fevers, chills, or cough. I am unsure, but she also received some IV treatments for malaria in [place] where she had been visiting which may have interfered with Biktarvy . Also she was overseas for an extended amount of time so there may have been a lapse in medications while abroad but daughter and patient report this was not the case. Daughter believes patient stopped taking ART once she returned from [place].    She is currently taking Biktarvy  for HIV and has previously been prescribed Bactrim . She has some Bactrim  left from a previous prescription and plans to resume taking it. She also takes Valtrex , which she describes as a 'big white pill'.    She was diagnosed with diabetes a few months ago and started on medication. She has not yet returned for a follow-up check-up. Daughter reports that patient admits to not taking Metformin  but daughter will encourage patient to start taking it. Daughter also discovered that patient's other daughter has been buying alcohol and soda for patient which can be contributing to elevated glucose.    She is not currently working and her daughter is assisting with her housing expenses. Joan Burns arrived in town on Friday and plans to stay until August.    Previous HPI:  12/26/22  Was in [place] for 8 months - had supply of medications  Had malaria while she was there, had malaria treatment while she was there. Occurred about 6 months before she returned to the [place] . Came back in May 2024.  Denies any fever, chills, nausea, vomiting, rash, urinary complaints, diarrhea, constipation.  Has put on 14 pounds since last visit.  Pain in chest, difficulty breathing she thinks from gaining weight. Sharp poking pain midsternally, noise with breathing. Happens at rest when sleeping, laying down, wakes her up, lasts about 10-15 minutes, drinks water and then restarts, can't sleep well. Happens every 3rd week. Last happened last Friday. Started 2 months ago.  Bilateral knees and back hurts.  Joan Burns is having a lot of health problems right now. She is trying to see if her sister in [place] can help her mom but for now she is the main contact for care of her mother. Patient agrees.    04/03/23  Received a letter about rent. Not sure what is happening with April rent. Has met with Child psychotherapist.  Problems with SNAP benefits - SW in to see patient today  In ED 03/02/23 seen for flu. Sent to breast clinic by [facility].  Has had  insomnia since having fever several weeks ago. Stays awake until 1 or 2 in the morning and has difficulty sleeping at night. Wakes up around 9 or 10 am. Spoke to [place] about this, she reports that another provider recommended using melatonin but it has not been bought by family helping mom at this time. She believes mom is sleeping well but is bored by not working so thinks she does not sleep that long.  Has nausea in the morning since having the fever. No vomiting. Has had some cough. During visit, patient reported that cough was keeping her up at night but patient did not cough during visit and daughter reports that patient is not coughing much and is able to sleep. I canceled tessalon  perles script.    Past Medical History:   Diagnosis Date    Dermatitis 04/14/2020 Herpes simplex infection of perianal skin     HIV disease       Other osteoporosis without current pathological fracture     Other specified hypothyroidism     Pressure injury of buttock, stage 2 (CMS-HCC) 07/11/2020       Social History  Background - From [place]. No longer works. Currently lives in [place], [place].    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 All others negative.      Medications and Allergies  She has a current medication list which includes the following prescription(s): alendronate , calcium carbonate-vitamin d3, cetirizine, jardiance , accrufer , lagevrio (eua), levothyroxine , metformin , biktarvy , accu-chek guide test strips, blood-glucose meter, jardiance , fluticasone propionate, lancets, naltrexone , polyvinyl alcohol/povidone/pf, sulfamethoxazole -trimethoprim , and valacyclovir .    Allergies: Tetanus-diphtheria toxoids-td      Family History  Her family history is not on file.          Objective:      BP 166/85 (BP Position: Sitting, BP Cuff Size: Medium)  - Pulse 78  - Temp 37 ??C (98.6 ??F) (Tympanic)  - Ht 162.6 cm (5' 4)  - Wt 77.7 kg (171 lb 3.2 oz)  - BMI 29.39 kg/m??   Wt Readings from Last 3 Encounters:   08/04/23 77.7 kg (171 lb 3.2 oz)   04/03/23 76.6 kg (168 lb 12.8 oz)   01/21/23 80.2 kg (176 lb 14.4 oz)       Const looks well and attentive, alert, appropriate   Eyes sclerae anicteric, noninjected OU   ENT no thrush, leukoplakia or oral lesions   Lymph no cervical or supraclavicular LAD           GI Soft, no organomegaly. NTND. NABS.   GU deferred   Rectal deferred   Skin no petechiae, ecchymoses or obvious rashes on clothed exam   MSK normal ROM throughout       Psych Appropriate affect. Eye contact good. Linear thoughts. Fluent speech.     Laboratory Data  Reviewed in Epic today, using Synopsis and Chart Review filters.    Lab Results   Component Value Date    CREATININE 0.67 08/04/2023    QFTTBGOLD Negative 06/29/2020    HEPCAB Reactive (A) 04/14/2020    HCVRNA  Not Detected 01/02/2023    CHOL 173 04/03/2023    HDL 39 (L) 04/03/2023    LDL 109 (H) 04/03/2023    NONHDL 134 (H) 04/03/2023    TRIG 125 04/03/2023    A1C 8.6 (H) 08/04/2023

## 2023-08-06 NOTE — Unmapped (Signed)
 COVID Education:  Make sure you perform good hand washing (lasting 20 seconds), continue to social distance and limit close personal contact (which may include new sexual partners or having multiple partners during this period).  Try to isolate at home but please find ways to keep in touch with those close to you, such as meeting up with them electronically or socially distanced, and the ability to go outdoors alone or separated from others  If you become ill with fever, respiratory illness, sudden loss of taste and smell, stomach issues, diarrhea, nausea, vomiting - contact clinic for further instructions.  You should go to the emergency department if you develop systems such as shortness of breath, confusion, lightheadedness when standing, high fever.   Here is some information about HIV and CoVid vaccines: [email or web address]  If you're interested in receiving the CoVid vaccine when you're eligible, here are some resources for you to check and make an appointment:  Your local health department   [email or web address] through [facility]  [email or web address]  [facility] (if you are an established patient with them)  [email or web address]    URGENT CARE  Please call ahead to speak with the nursing staff if you are in need of an urgent appointment.       MEDICATIONS  For refills please contact your pharmacy and ask them to electronically send or fax the request to the clinic.   Please bring all medications in original bottles to every appointment.    HMAP (formerly ADAP) or [employer] Eligibility (required even if you do not receive medication through [facility])  Please remember to renew your [employer] eligibility during renewal periods which occur twice a year: January-March and July-September.     The following are needed for each renewal:   - [place]  Identification (if you don't have one, then a bill with your name and address in [place] )   - proof of income (award letter, W-2, or last three check stubs)   If you are unable to come in for renewal, let us  know if we can mail, fax or e-mail paperwork to you.   HMAP Contact: [phone].     Lab info:  Your most recent CD4 T-cell counts and viral loads are below. Here are a few things to keep in mind when looking at your numbers:  Our goal is to get your virus to be undetectable and keep it undetectable. If the virus is undetectable you are much more likely to stay healthy.  We consider your viral load to be undetectable if it says <40 or if it says Not detected.  For most people, we're checking CD4 counts every other visit (once or twice a year, or sometimes even less).  It's normal for your CD4 count to be different from visit to visit.   You can help by taking your medications at about the same time, every single day. If you're having trouble with taking your medications, it's important to let us  know.    Lab Results   Component Value Date    ACD4 110 (L) 08/04/2023    CD4 5 (L) 08/04/2023    HIVCP 241 (H) 08/04/2023    HIVRS Detected (A) 08/04/2023        Please note that your laboratory and other results may be visible to you in real time, possibly before they reach your provider. Please allow 48 hours for clinical interpretation of these results. Importantly, even if a result is flagged as abnormal, it may not be one  that impacts your health.    It was nice to have a visit with you today!  Follow-up information:        Provider today:  Shadrach Bartunek, FNP-BC      ID CLINIC address:   [facility] Infectious Diseases Clinic at [facility]  [address]  [place], [place] [address]    Contact information:    The ID clinic phone number is [phone]   The ID clinic fax number is [phone]  For urgent issues on nights and weekends: Call the ID Physician on-call through the [facility] Operator at [phone].    Please sign up for My [facility] Chart - This is a great way to review your labs and track your appointments    Please try to arrive 30 minutes BEFORE your scheduled appointment time!  This will give you time to fill out any front desk paperwork needed for your visit, and allow you to be seen as close to your scheduled appointment time as possible.

## 2023-08-07 NOTE — Unmapped (Signed)
 [facility] Clinical Assessment & Refill Coordination Note    Joan Burns, DOB: April 09, 1950  Phone: [phone] (home)     All above HIPAA information was verified with patient's family member, daughter, Pleas.     Was a Nurse, learning disability used for this call? No    Specialty Medication(s):   Infectious Disease: Biktarvy      Current Medications[1]     Changes to medications: Rushie reports no changes at this time.    Medication list has been reviewed and updated in Epic: Yes    Allergies[2]    Changes to allergies: No    Allergies have been reviewed and updated in Epic: Yes    SPECIALTY MEDICATION ADHERENCE     Biktarvy  50-200-25 mg: 7 days of medicine on hand       Medication Adherence    Patient reported X missed doses in the last month: 0  Specialty Medication: Biktarvy  50-200-25mg  QD  Patient is on additional specialty medications: No  Informant: child/children          Specialty medication(s) dose(s) confirmed: Regimen is correct and unchanged.     Are there any concerns with adherence? No    Adherence counseling provided? Not needed    CLINICAL MANAGEMENT AND INTERVENTION      Clinical Benefit Assessment:    Do you feel the medicine is effective or helping your condition? Patient declined to answer    Clinical Benefit counseling provided? Not needed    Adverse Effects Assessment:    Are you experiencing any side effects? No    Are you experiencing difficulty administering your medicine? No    Quality of Life Assessment:    Quality of Life    Rheumatology  Oncology  Dermatology  Cystic Fibrosis          How many days over the past month did your HIV  keep you from your normal activities? For example, brushing your teeth or getting up in the morning. Patient declined to answer    Have you discussed this with your provider? Not needed    Acute Infection Status:    Acute infections noted within Epic:  No active infections    Patient reported infection: None    Therapy Appropriateness:    Is therapy appropriate based on current medication list, adverse reactions, adherence, clinical benefit and progress toward achieving therapeutic goals? Yes, therapy is appropriate and should be continued     Clinical Intervention:    Was an intervention completed as part of this clinical assessment? No    DISEASE/MEDICATION-SPECIFIC INFORMATION      N/A    HIV: Not Applicable    HIV ASSOCIATED LABS:     Lab Results   Component Value Date/Time    HIVRS Detected (A) 08/04/2023 11:39 AM    HIVRS Detected (A) 04/03/2023 04:17 PM    HIVRS Not Detected 01/02/2023 03:16 PM    HIVCP 241 (H) 08/04/2023 11:39 AM    HIVCP 18,608 (H) 04/03/2023 04:17 PM    HIVCP 55 (H) 10/02/2021 09:13 AM    ACD4 110 (L) 08/04/2023 11:38 AM    ACD4 130 (L) 04/03/2023 04:17 PM    ACD4 128 (L) 01/02/2023 03:16 PM       PATIENT SPECIFIC NEEDS     Does the patient have any physical, cognitive, or cultural barriers? No    Is the patient high risk? No    Does the patient require physician intervention or other additional services (i.e., nutrition,  smoking cessation, social work)? No    Does the patient have an additional or emergency contact listed in their chart? Yes    SOCIAL DETERMINANTS OF HEALTH     At the [facility], we have learned that life circumstances - like trouble affording food, housing, utilities, or transportation can affect the health of many of our patients.   That is why we wanted to ask: are you currently experiencing any life circumstances that are negatively impacting your health and/or quality of life? 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 couch-surfing)?: No     Are you worried about losing your housing?: No   Physical Activity: Not on file   Utilities: Low Risk  (03/18/2023)    Utilities     Within the past 12 months, have you been unable to get utilities (heat, electricity) when it was really needed?: No   Stress: Not on file   Interpersonal Safety: Not on file   Substance Use: Not on file (11/27/2022)   Intimate Partner Violence: Not on file   Social Connections: Not on file   Financial Resource Strain: High Risk (03/18/2023)    Overall Financial Resource Strain (CARDIA)     Difficulty of Paying Living Expenses: Hard   Health Literacy: Not on file   Internet Connectivity: Not on file       Would you be willing to receive help with any of the needs that you have identified today? Not applicable       SHIPPING     Specialty Medication(s) to be Shipped:   Infectious Disease: Biktarvy     Other medication(s) to be shipped: alendronate , Jardiance , levothyroxine , metformin , bactrim  SS, valacyclovir , Accrufer      Changes to insurance: No    Cost and Payment: Patient has a copay of $28. They are aware and have authorized the pharmacy to charge the credit card on file.    Delivery Scheduled: Yes, Expected medication delivery date: 08/12/23.     Medication will be delivered via UPS to the confirmed temporary address in [facility].    The patient will receive a drug information handout for each medication shipped  and additional FDA Medication Guides as required.  Verified that patient has previously received a Conservation officer, historic buildings and a Surveyor, mining.    The patient or caregiver noted above participated in the development of this care plan and knows that they can request review of or adjustments to the care plan at any time.      All of the patient's questions and concerns have been addressed.    Aleck CHRISTELLA Gaskins, PharmD   [facility] Specialty Pharmacist       [1]   Current Outpatient Medications   Medication Sig Dispense Refill    alendronate  (FOSAMAX ) 70 MG tablet Take 1 tablet (70 mg total) by mouth every seven (7) days. 12 tablet 1    bictegrav-emtricit-tenofov ala (BIKTARVY ) 50-200-25 mg tablet Take 1 tablet by mouth in the morning. 90 tablet 3    empagliflozin  (JARDIANCE ) 25 mg tablet Take 1 tablet (25 mg total) by mouth daily. 90 tablet 1    ferric maltol  (ACCRUFER ) 30 mg cap Take 1 capsule (30 mg) by mouth two (2) times a day. 180 capsule 2    levothyroxine  (SYNTHROID ) 125 MCG tablet Take 1 tablet (125 mcg total) by mouth daily. 90 tablet 2    metFORMIN  (GLUCOPHAGE ) 500 MG tablet Take 1 tablet (500 mg total) by mouth two (2) times a day. for diabetes. 180 tablet 1    sulfamethoxazole -trimethoprim  (BACTRIM ) 400-80 mg per tablet Take 1 tablet (80 mg of trimethoprim  total) by mouth daily. 30 tablet 5    valACYclovir  (VALTREX ) 1000 MG tablet Take 1 tablet (1,000 mg total) by mouth daily. 90 tablet 3    blood sugar diagnostic (ACCU-CHEK GUIDE TEST STRIPS) Strp Use with glucose meter  (2) times a day to check blood sugar levels 100 strip 1    blood-glucose meter (ACCU-CHEK GUIDE GLUCOSE METER) Misc use as directed. 1 each 0    calcium carbonate-vitamin D3 600 mg-10 mcg (400 unit) per tablet Take 1 tablet by mouth daily.      cetirizine (ZYRTEC) 10 MG tablet Take 1 tablet (10 mg total) by mouth daily.      fluticasone propionate (FLONASE) 50 mcg/actuation nasal spray 1 spray into each nostril daily. 1 pulv??risation dans chaque narine par jour 16 g 0    LAGEVRIO, EUA, 200 mg capsule TAKE 4 CAPSULES BY MOUTH TWICE A DAY X 5 DAYS (Patient not taking: Reported on 08/07/2023)      lancets (ACCU-CHEK SOFTCLIX LANCETS) Misc Use as directed 100 each 0    naltrexone  (DEPADE) 50 mg tablet take 1/2 tablet by mouth every day for two weeks then increase to one tablet by mouth every day thereafter 90 tablet 0    polyvinyl alcohol/povidone/PF (REFRESH CLASSIC, PF, OPHT) Apply to eye.       No current facility-administered medications for this visit.   [2]   Allergies  Allergen Reactions    Tetanus-Diphtheria Toxoids-Td Rash

## 2023-08-14 MED FILL — JARDIANCE 25 MG TABLET: ORAL | 90 days supply | Qty: 90 | Fill #1

## 2023-08-14 MED FILL — METFORMIN 500 MG TABLET: ORAL | 90 days supply | Qty: 180 | Fill #1

## 2023-08-14 MED FILL — ACCRUFER 30 MG CAPSULE: ORAL | 30 days supply | Qty: 60 | Fill #1

## 2023-08-14 MED FILL — LEVOTHYROXINE 125 MCG TABLET: ORAL | 90 days supply | Qty: 90 | Fill #1

## 2023-08-20 MED ORDER — ERGOCALCIFEROL (VITAMIN D2) 1,250 MCG (50,000 UNIT) CAPSULE
ORAL_CAPSULE | ORAL | 3 refills | 91.00000 days
Start: 2023-08-20 — End: ?

## 2023-08-26 MED ORDER — BLOOD-GLUCOSE METER
0 refills | 0.00000 days
Start: 2023-08-26 — End: ?

## 2023-09-05 NOTE — Unmapped (Unsigned)
 INFECTIOUS DISEASES CLINIC  [address]  [place], [place]  [address]  P [phone]  F [phone]     Primary care provider: Gayle Verneita Molt, FNP    Assessment/Plan:      [name] primary contact is still Pleas (daughter). She schedules patient's Medicaid transport and manages her medical care. This may change down the road but [name] and patient firm that she is still main contact.    HIV (dx'd 2015, nadir CD4 76/4% in 07/20/20)  - chronic, severe  Patient diagnosed with HIV in 2015 as part of pre-op work up for myomectomy. Per report from daughter, patient had declined treatment and her daughters through the years had tried to get her into care but was unable to, both here in [place] and in [place]. They were unable to establish care for her in [place] because her Medicaid was in [place].    Patient speaks Tshiluba or Luba-Kasai, a [place] language, that can be accessed by [employer] (via telephone [phone]) if an appointment is made with them. Patient actually speaks a combination of Swahili and Tshiluba but prefers interpretation in Tshiluba/Luba-K[place]ai. [place] interpreter can be used if Tsiluba/Luba-kasai interpreter cannot be found. Does not prefer Swahili.    [place] interpreter used which was patient's preference. Patient is unaccompanied. Discussed care with [name] after the visit today.    Overall doing well. Current regimen: Biktarvy  (BIC/FTC/TAF)  Misses doses of ARVs never     Med access via Medicaid  CD4 count 102/6% after starting Biktarvy , on Bactrim  SS daily. Repeat CD4 today. Will stop Bactrim  if continues to be virally suppressed.  Discussed ARV adherence and taking ARVs with food    Lab Results   Component Value Date    ACD4 110 (L) 08/04/2023    CD4 5 (L) 08/04/2023    HIVRS Detected (A) 08/04/2023    HIVCP 241 (H) 08/04/2023     CD4, HIV RNA, and safety labs (full return panel) and genotyping (PR, RT, and IN)   Continue current therapy On Biktarvy  and Bactrim  SS, restart Bactrim  as patient is not virally suppressed at this time so will need opportunistic infection prevention.  Discussed importance of ARV adherence  Patient firm on not missing Biktarvy  doses while abroad. Rapid increase in viral load at last visit hard to explain otherwise. Patient had received IV infusions for malaria while in [place], it is possible that whatever was in infusion might have interfered with medication. In this case, there is some concern for resistance developing, will obtain genotype today in case patient continues to be viremic.  Patient does not like blood draws/needles which has led her to canceling her health appointments. I have drawn labs today in hopes that PCP will not require testing at this upcoming visit.  Assessment & Plan  Diabetes mellitus - new  Elevated A1c with recent initiation of diabetes medication. Follow-up pending.  - Ensure adherence to diabetes medication.  - Schedule follow-up for diabetes management with PCP.    HSV-2 infection/Perianal lesions  - resolved  Reports that lesion started by itching, which became a vesicle and then because of itching, the lesions opened. Watery, clear discharge has been draining since then, leasions seems to be getting bigger. Not experiencing pain, but is itchy.   07/11/20 Evaluated by PCP with surface swab revealing mixed gram positive organisms. Started on Bactrim  SS BID.  Dr. JOHNITA Sink in to evaluate patient along with myself. Skin erosions are characteristic of HSV infection that may also  be superinfected with bacteria.  Patient given Valtrex  1000mg  daily for HSV suppression. Refills x 1 year    HIV retinopathy  - chronic   Diagnosed by Ophthalmology, Dr. Bambi  Incidental finding of CWS when patient being evaluated for cataracts.  Daughter reports that patient has not complained of vision changes prior to this diagnosis.  Continue close follow up with Ophthalmology, next appointment due 06/2021 but has not followed up - still needs to schedule    Right breast mass, incidentally found - chronic  Patient had not reported breast mass at previous visits though according to patient and daughter this was a mass present for some years. Patient reports size is stable. No LAD on exam.  Obtain chest x-ray - unremarkable results today  Obtain diagnostic bilateral mammogram with ultrasound if needed. - Appointment made for 02/11/23, soonest available. [name] (daughter) is planning to come to [place] so that she can accompany patient to this visit.  02/11/23 mass imaged. BIRADS-2 repeat in 01/2024, US  shows no malignancy  Was seen at [facility] ED and referred to Breast Clinic    Malaria infection (by patient report) (12/2021)  Infected and treated while in [place].  Reports having malaria treatment x 3 months with oral medication. Followed up with doctor in [place] who said she was cured.  Had joint pain with infection, now resolved.  No symptoms since treatment.  Discussed with Dr. DOROTHA Farr who does not recommend any medications at this time but if she becomes symptomatic - malaise, fatigue, fevers, chills, should order [id number] - Malaria exam, peripheral blood.    +HepC Ab/HCV RNA not detected (03/2020)  Consider baseline ultrasound.   Confirm no treatment for Hep C  01/02/23 Hep C RNA not detected    Uterine leiomyoma - chronic  Had uterine surgery in 2014 per patient report  Seen by GYN 12/27/20 and no further evaluation needed at this time other than routine well woman exam.     Hypothyroidism - chronic, stable  Managed by PCP    Alcohol use disorder - chronic - not addressed today  Drinks 5 beers or wine a day  Since being on antibiotics, patient drinks 1-2 drinks daily.    Right knee pain - chronic, exacerbated - did not address at this visit  Patient has chronic right knee complaints, evaluated by ED for it on 09/25/21 (advised to see PCP about this) and Dr. Keven with [facility] on 10/02/21.   US  PVL ordered for patient and has appointment 10/31/21.  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  Right knee today is slightly swollen and tender somewhat to touch though no signs of infection. Has pain with opposing forces to RLE.  At last visit with Dr. Keven, imaging ordered with possibility for knee injection.  On 11/02/21, seen for knee pain. Given orders for PT and IBU. Suspected autoimmune disorder.   Patient continues to have bilateral knee pain, unable to address with competing priorities.  Needs to schedule PT.    Reported history of Bell's Palsy and Shingles outbreak    Sexual health & secondary prevention  - abstinent  Not in relationship. Last sexual encounter >5 years ago, remains abstinent  She is abstinent  She does not routinely discuss HIV status with partner(s).  Has children and post menopausal.    Lab Results   Component Value Date    RPR Nonreactive 01/02/2023  RPR Nonreactive 07/20/2020     GC/CT NAATs - patient declined screening  RPR - for screening obtained today      Health maintenance  - chronic, acutely exacerbated  PCP: Verneita Sacks, FNP/ Dr. Keven  Elevated A1C and lower H/H, has appointment 04/11/23    Oral health  She does not have a dentist. Last dental exam for some years. 08/06/2023 - referral placed    Eye health  She does  use corrective lenses. Last eye exam followed by Opthalmology. 08/06/2023 - reminded daughter to arrange for follow up due to HIV retinopathy but also with new diabetes diagnosis.      Metabolic conditions  Wt Readings from Last 5 Encounters:   08/04/23 77.7 kg (171 lb 3.2 oz)   04/03/23 76.6 kg (168 lb 12.8 oz)   01/21/23 80.2 kg (176 lb 14.4 oz)   01/02/23 80.1 kg (176 lb 9.6 oz)   12/26/22 78.9 kg (174 lb)     Lab Results   Component Value Date    CREATININE 0.67 08/04/2023    GLUCOSEU Trace (A) 01/02/2023    ALBCRERAT 2.0 01/02/2023    GLU 169 08/04/2023    A1C 8.6 (H) 08/04/2023    ALT 11 08/04/2023    ALT 11 04/03/2023    ALT 11 01/02/2023     # Kidney health - SCr and eGFR   # Bone health - defer mgm't to PCP, on Fosamax   # Diabetes assessment - random glucose today  # NAFLD assessment - monitor over time    Communicable diseases  Lab Results   Component Value Date    QFTTBGOLD Negative 06/29/2020    HEPAIGG Reactive (A) 07/20/2020    HEPBSAB Reactive (A) 07/20/2020    HEPCAB Reactive (A) 04/14/2020    HCVRNA Not Detected 01/02/2023     # TB screening - no longer needed; negative IGRA, low risk  # Hepatitis screening - as noted: See above, Hep A/B IMMUNE  # MMR screening - not assessed    Cancer screening  No results found for: PSASCRN, PSA, PAP, FINALDX  # Cervical - Defer management to GYN  # Breast - repeat mammogram 62m from prior    # Anorectal - will disucss  # Colorectal - Defer to PCP  # Liver - no screening indicated  # Lung - screening not indicated    Cardiovascular disease  Lab Results   Component Value Date    CHOL 173 04/03/2023    HDL 39 (L) 04/03/2023    LDL 109 (H) 04/03/2023    NONHDL 134 (H) 04/03/2023    TRIG 125 04/03/2023     # The 10-year ASCVD risk score (Arnett DK, et al., 2019) is: 28%  - is not taking aspirin   - is not taking statin  - BP control good  - never smoker  # AAA screening - no indication for screening    Immunization History   Administered Date(s) Administered    COVID-19 VACCINE,MRNA(MODERNA)(PF) 10/06/2020, 11/03/2020    Influenza Vaccine Quad(IM)6 MO-Adult(PF) 10/30/2021     Immunizations- needs PCV-21, will await next check of CD4 count.  Patient received primary CoVid series and reports getting subsequent CoVid boosters through her work place.- patient currently not working    I personally spent 35 minutes face-to-face and non-face-to-face in the care of this patient, which includes all pre, intra, and post visit time on the date of service.   Time for translation extended out our discussions.  Disposition  Next appointment: 4 weeks       To do @ next RTC  Discuss anal pap.  Alcohol history  Consider abdominal ultrasound  PCV-21      Delvin Gunner, FNP-BC  [facility] Infectious Diseases Clinic at [facility]  [address], [place], [place] [address]    Phone: [phone]   Fax: [phone]             Subjective      Chief Complaint   HIV follow up    HPI  In addition to details in A&P above:  History of Present Illness  08/04/2023  Joan Burns is a 73 year old female with HIV who presents for HIV follow-up due to concerns about increased viral load. She is accompanied by her daughter, Pleas.    Her viral load was previously high, decreased with medication adherence, but has recently increased again. She denies stopping her medication and is unsure why the viral load has increased. No symptoms of infectious disease, including fevers, chills, or cough. I am unsure, but she also received some IV treatments for malaria in [place] where she had been visiting which may have interfered with Biktarvy . Also she was overseas for an extended amount of time so there may have been a lapse in medications while abroad but daughter and patient report this was not the case. Daughter believes patient stopped taking ART once she returned from [place].    She is currently taking Biktarvy  for HIV and has previously been prescribed Bactrim . She has some Bactrim  left from a previous prescription and plans to resume taking it. She also takes Valtrex , which she describes as a 'big white pill'.    She was diagnosed with diabetes a few months ago and started on medication. She has not yet returned for a follow-up check-up. Daughter reports that patient admits to not taking Metformin  but daughter will encourage patient to start taking it. Daughter also discovered that patient's other daughter has been buying alcohol and soda for patient which can be contributing to elevated glucose.    She is not currently working and her daughter is assisting with her housing expenses. [name] arrived in town on Friday and plans to stay until August.    Previous HPI:  12/26/22  Was in [place] for 8 months - had supply of medications  Had malaria while she was there, had malaria treatment while she was there. Occurred about 6 months before she returned to the [place] . Came back in May 2024.  Denies any fever, chills, nausea, vomiting, rash, urinary complaints, diarrhea, constipation.  Has put on 14 pounds since last visit.  Pain in chest, difficulty breathing she thinks from gaining weight. Sharp poking pain midsternally, noise with breathing. Happens at rest when sleeping, laying down, wakes her up, lasts about 10-15 minutes, drinks water and then restarts, can't sleep well. Happens every 3rd week. Last happened last Friday. Started 2 months ago.  Bilateral knees and back hurts.  [name] is having a lot of health problems right now. She is trying to see if her sister in [place] can help her mom but for now she is the main contact for care of her mother. Patient agrees.    04/03/23  Received a letter about rent. Not sure what is happening with April rent. Has met with Child psychotherapist.  Problems with SNAP benefits - SW in to see patient today  In ED 03/02/23 seen for flu. Sent to breast clinic by [facility].  Has had  insomnia since having fever several weeks ago. Stays awake until 1 or 2 in the morning and has difficulty sleeping at night. Wakes up around 9 or 10 am. Spoke to [employer] about this, she reports that another provider recommended using melatonin but it has not been bought by family helping mom at this time. She believes mom is sleeping well but is bored by not working so thinks she does not sleep that long.  Has nausea in the morning since having the fever. No vomiting. Has had some cough. During visit, patient reported that cough was keeping her up at night but patient did not cough during visit and daughter reports that patient is not coughing much and is able to sleep. I canceled tessalon  perles script.    Past Medical History:   Diagnosis Date    Dermatitis 04/14/2020 Herpes simplex infection of perianal skin     HIV disease        Other osteoporosis without current pathological fracture     Other specified hypothyroidism     Pressure injury of buttock, stage 2 (CMS-HCC) 07/11/2020       Social History  Background - From [place]. No longer works. Currently lives in [place], [place].    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 All others negative.      Medications and Allergies  She has a current medication list which includes the following prescription(s): alendronate , biktarvy , accu-chek guide test strips, blood-glucose meter, calcium carbonate-vitamin d3, cetirizine, jardiance , ergocalciferol -1,250 mcg (50,000 unit), accrufer , fluticasone propionate, lagevrio (eua), lancets, levothyroxine , metformin , naltrexone , polyvinyl alcohol/povidone/pf, sulfamethoxazole -trimethoprim , and valacyclovir .    Allergies: Tetanus-diphtheria toxoids-td      Family History  Her family history is not on file.          Objective:      There were no vitals taken for this visit.  Wt Readings from Last 3 Encounters:   08/04/23 77.7 kg (171 lb 3.2 oz)   04/03/23 76.6 kg (168 lb 12.8 oz)   01/21/23 80.2 kg (176 lb 14.4 oz)       Const looks well and attentive, alert, appropriate   Eyes sclerae anicteric, noninjected OU   ENT no thrush, leukoplakia or oral lesions   Lymph no cervical or supraclavicular LAD           GI Soft, no organomegaly. NTND. NABS.   GU deferred   Rectal deferred   Skin no petechiae, ecchymoses or obvious rashes on clothed exam   MSK normal ROM throughout       Psych Appropriate affect. Eye contact good. Linear thoughts. Fluent speech.     Laboratory Data  Reviewed in Epic today, using Synopsis and Chart Review filters.    Lab Results   Component Value Date    CREATININE 0.67 08/04/2023    QFTTBGOLD Negative 06/29/2020    HEPCAB Reactive (A) 04/14/2020    HCVRNA Not Detected 01/02/2023    CHOL 173 04/03/2023    HDL 39 (L) 04/03/2023    LDL 109 (H) 04/03/2023    NONHDL 134 (H) 04/03/2023  TRIG 125 04/03/2023    A1C 8.6 (H) 08/04/2023

## 2023-10-06 NOTE — Unmapped (Signed)
 Spoke confidentially with patient's son/daughter to schedule appointment for retention. Appointment is set for 10/13/2023.    Barriers to Care:  Are you experiencing any barriers to care like: transportation to medical appointments, stable housing, lack of food, etc.? No  Are you currently taking your medication(s) as prescribed? Yes    Referral Outcome: N/A    [facility] Care Plan: Viral Load Reduction Patient     [name] last ID clinic appt: 08/04/2023    [name] last HIV Viral Load: 08/04/2023 (>= 200 c/mL)    Goal:  Patient will have an office visit within 6 weeks of identification of detectable viral load lab (>= 51 c/mL) unless stated otherwise by their provider.     Contact Interventions may include:  Placing phone calls to the patient and/or listed contacts.  Sending the patient an Abnormal Lab text message through the [employer] App or [facility] cell phone (if a texting agreement is signed).  Sending the patient an Abnormal Lab MyChart/letter.  Contacting the patient's most recent pharmacies as needed for additional information such as contact information or refill history.  Contacting the patient's case manager/caregiver as needed for additional information, if assigned.  Researching other online resources as needed.     If the patient is reached, the [employer] will attempt to:  Update all contact information including primary and secondary phone numbers and emergency contact details and address. Confirm access to MyChart.  Complete the Detectable Viral Load Intervention questionnaire with the patient.  Explore and discuss contributing factors to the last detectable viral load including: Medication access, tolerability or side effects, frequency of missed doses of ART, and other medication adherence concerns.  Explore and discuss any barriers to visit attendance the patient may be experiencing that result in missed appointments, no-shows, cancellations, or lack of a scheduled follow-up appointment. These may include communication barriers (language, phone access, texting, MyChart), transportation, financial concerns, time off work, mental health/substance use, and/or multiple medical appointments.  Make appropriate referrals and link the patient to clinic nurses, social workers, benefits counselors, or other support services in the [facility] ID clinic to address concerns or barriers to care.  Schedule a follow-up appointment at an appropriate interval based on a completed assessment.  Monitor attendance of the patient's scheduled appointment and provide reminder  calls/texts/messages consistent with patient preference, if needed.  Continue follow-up as indicated.     Expected Outcome:  The patient will have a return visit within 6 weeks of identification of detectable viral load lab (>= 51 c/mL) unless stated otherwise by their provider.    If the patient has an urgent medical problem send a chat message to the clinic nursing staff for immediate follow-up.     **If the patient is not able to be located or retained in HIV care, a referral will be placed to the [facility] HIV [facility] Counselor for further follow-up.    Duration of Service: 10 minutes    Eoin Willden  Linkage and Firefighter Infectious Diseases-[place]

## 2023-10-15 NOTE — Unmapped (Signed)
 Sent MyChart message to patient to re-engage in care.     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     Contact Interventions may include:  Placing phone calls to the patient and/or listed contacts.  Sending the patient an Abnormal Lab text message through the [employer] App or [facility] cell phone (if a texting agreement is signed).  Sending the patient an Abnormal Lab MyChart/letter.  Contacting the patient's most recent pharmacies as needed for additional information such as contact information or refill history.  Contacting the patient's case manager/caregiver as needed for additional information, if assigned.  Researching other online resources as needed.     If the patient is reached, the [employer] will attempt to:  Update all contact information including primary and secondary phone numbers and emergency contact details and address. Confirm access to MyChart.  Complete the Detectable Viral Load Intervention questionnaire with the patient.  Explore and discuss contributing factors to the last detectable viral load including: Medication access, tolerability or side effects, frequency of missed doses of ART, and other medication adherence concerns.  Explore and discuss any barriers to visit attendance the patient may be experiencing that result in missed appointments, no-shows, cancellations, or lack of a scheduled follow-up appointment. These may include communication barriers (language, phone access, texting, MyChart), transportation, financial concerns, time off work, mental health/substance use, and/or multiple medical appointments.  Make appropriate referrals and link the patient to clinic nurses, social workers, benefits counselors, or other support services in the [facility] ID clinic to address concerns or barriers to care.  Schedule a follow-up appointment at an appropriate interval based on a completed assessment.  Monitor attendance of the patient's scheduled appointment and provide reminder  calls/texts/messages consistent with patient preference, if needed.  Continue follow-up as indicated.     Expected Outcome:  The patient will have a return visit within 6 weeks of identification of detectable viral load lab (>= 51 c/mL) unless stated otherwise by their provider.    If the patient has an urgent medical problem send a chat message to the clinic nursing staff for immediate follow-up.     **If the patient is not able to be located or retained in HIV care, a referral will be placed to the [facility] HIV [facility] Counselor for further follow-up.    Duration of Service: 9 minutes    Nafisa Olds  Linkage and Firefighter Infectious Diseases-[place]

## 2023-10-23 NOTE — Unmapped (Signed)
 Sent MyChart message to patient to re-engage in care.     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     Contact Interventions may include:  Placing phone calls to the patient and/or listed contacts.  Sending the patient an Abnormal Lab text message through the [employer] or [facility] cell phone (if a texting agreement is signed).  Sending the patient an Abnormal Lab MyChart/letter.  Contacting the patient's most recent pharmacies as needed for additional information such as contact information or refill history.  Contacting the patient's case manager/caregiver as needed for additional information, if assigned.  Researching other online resources as needed.     If the patient is reached, the [employer] will attempt to:  Update all contact information including primary and secondary phone numbers and emergency contact details and address. Confirm access to MyChart.  Complete the Detectable Viral Load Intervention questionnaire with the patient.  Explore and discuss contributing factors to the last detectable viral load including: Medication access, tolerability or side effects, frequency of missed doses of ART, and other medication adherence concerns.  Explore and discuss any barriers to visit attendance the patient may be experiencing that result in missed appointments, no-shows, cancellations, or lack of a scheduled follow-up appointment. These may include communication barriers (language, phone access, texting, MyChart), transportation, financial concerns, time off work, mental health/substance use, and/or multiple medical appointments.  Make appropriate referrals and link the patient to clinic nurses, social workers, benefits counselors, or other support services in the [facility] ID clinic to address concerns or barriers to care.  Schedule a follow-up appointment at an appropriate interval based on a completed assessment.  Monitor attendance of the patient's scheduled appointment and provide reminder  calls/texts/messages consistent with patient preference, if needed.  Continue follow-up as indicated.     Expected Outcome:  The patient will have a return visit within 6 weeks of identification of detectable viral load lab (>= 51 c/mL) unless stated otherwise by their provider.    If the patient has an urgent medical problem send a chat message to the clinic nursing staff for immediate follow-up.     **If the patient is not able to be located or retained in HIV care, a referral will be placed to the [facility] HIV [facility] Counselor for further follow-up.    Duration of Service: 9 minutes    Joan Burns  Linkage and Firefighter [facility]ious [facility]

## 2023-11-06 NOTE — Unmapped (Signed)
 [facility] Refill Coordination Note    Specialty Medication(s) to be Shipped:   Infectious Disease: BIKTARVY  50-200-25 mg tablet (bictegrav-emtricit-tenofov ala)    Other medication(s) to be shipped: No additional medications requested for fill at this time    Specialty Medications not needed at this time: N/A     Joan Burns, DOB: 1950-06-10  Phone: [phone] (home)       All above HIPAA information was verified with patient's family member, daughter Pleas.     Was a Nurse, learning disability used for this call? No    Completed refill call assessment today to schedule patient's medication shipment from the [facility]  [phone]).  All relevant notes have been reviewed.     Specialty medication(s) and dose(s) confirmed: Regimen is correct and unchanged.   Changes to medications: Samayra reports no changes at this time.  Changes to insurance: No  New side effects reported not previously addressed with a pharmacist or physician: None reported  Questions for the pharmacist: No    Confirmed patient received a Conservation officer, historic buildings and a Surveyor, mining with first shipment. The patient will receive a drug information handout for each medication shipped and additional FDA Medication Guides as required.       DISEASE/MEDICATION-SPECIFIC INFORMATION        N/A    SPECIALTY MEDICATION ADHERENCE     Medication Adherence    Patient reported X missed doses in the last month: 0  Specialty Medication: BIKTARVY  50-200-25 mg tablet (bictegrav-emtricit-tenofov ala)  Patient is on additional specialty medications: No  Informant: child/children              Were doses missed due to medication being on hold? No    BIKTARVY  50-200-25 mg tablet (bictegrav-emtricit-tenofov ala) : 4-5 days of medicine on hand     REFERRAL TO PHARMACIST     Referral to the pharmacist: Not needed      [facility]     Shipping address confirmed in Epic.     Cost and Payment: Patient has a $0 copay, payment information is not required.    Delivery Scheduled: Yes, Expected medication delivery date: 10/21.     Medication will be delivered via UPS to the prescription address in Epic WAM.    Jeoffrey JAYSON Sherra [facility] and [facility]

## 2023-11-10 MED FILL — BIKTARVY 50 MG-200 MG-25 MG TABLET: ORAL | 90 days supply | Qty: 90 | Fill #1

## 2023-11-11 NOTE — Unmapped (Addendum)
 Attempted to call patient's son/daughter for patient's re-engagement in care. Left a discreet 'appointment needed' message in voicemail box.     Addendum: Daughter called back and scheduled future appointment. Spoke confidentially with patient's son/daughter to schedule appointment for retention. Appointment is set for 12/25/2023.     Barriers to Care:  Are you experiencing any barriers to care like: transportation to medical appointments, stable housing, lack of food, etc.? Yes; patient is out of town until 12/19/2023.  Are you currently taking your medication(s) as prescribed? Yes      Referral Outcome: N/A        [facility] Care Plan: Viral Load Reduction Patient     [name] last ID clinic appt: 08/04/2023    [name] last HIV Viral Load: 08/04/2023 (>= 200 c/mL)    Goal:  Patient will have an office visit within 6 weeks of identification of detectable viral load lab (>= 51 c/mL) unless stated otherwise by their provider.     Contact Interventions may include:  Placing phone calls to the patient and/or listed contacts.  Sending the patient an Abnormal Lab text message through the [employer] or [facility] cell phone (if a texting agreement is signed).  Sending the patient an Abnormal Lab MyChart/letter.  Contacting the patient's most recent pharmacies as needed for additional information such as contact information or refill history.  Contacting the patient's case manager/caregiver as needed for additional information, if assigned.  Researching other online resources as needed.     If the patient is reached, the [employer] will attempt to:  Update all contact information including primary and secondary phone numbers and emergency contact details and address. Confirm access to MyChart.  Complete the Detectable Viral Load Intervention questionnaire with the patient.  Explore and discuss contributing factors to the last detectable viral load including: Medication access, tolerability or side effects, frequency of missed doses of ART, and other medication adherence concerns.  Explore and discuss any barriers to visit attendance the patient may be experiencing that result in missed appointments, no-shows, cancellations, or lack of a scheduled follow-up appointment. These may include communication barriers (language, phone access, texting, MyChart), transportation, financial concerns, time off work, mental health/substance use, and/or multiple medical appointments.  Make appropriate referrals and link the patient to clinic nurses, social workers, benefits counselors, or other support services in the [facility] ID clinic to address concerns or barriers to care.  Schedule a follow-up appointment at an appropriate interval based on a completed assessment.  Monitor attendance of the patient's scheduled appointment and provide reminder  calls/texts/messages consistent with patient preference, if needed.  Continue follow-up as indicated.     Expected Outcome:  The patient will have a return visit within 6 weeks of identification of detectable viral load lab (>= 51 c/mL) unless stated otherwise by their provider.    If the patient has an urgent medical problem send a chat message to the clinic nursing staff for immediate follow-up.     **If the patient is not able to be located or retained in HIV care, a referral will be placed to the [facility] HIV [facility] Counselor for further follow-up.    Duration of Service: 9 minutes    Joan Burns  Linkage and Firefighter [facility]ious Diseases[facility]

## 2023-12-24 NOTE — Progress Notes (Unsigned)
 INFECTIOUS DISEASES CLINIC  [address]  [place], [place]  [address]  P [phone]  F [phone]     Primary care provider: Gayle Verneita Molt, FNP    Assessment/Plan:      [name] primary contact is still Joan Burns (daughter). She schedules patient's Medicaid transport and manages her medical care. This may change down the road but Joan Burns and patient firm that she is still main contact.    HIV (dx'd 2015, nadir CD4 76/4% in 07/20/20)  - chronic, severe  Patient diagnosed with HIV in 2015 as part of pre-op work up for myomectomy. Per report from daughter, patient had declined treatment and her daughters through the years had tried to get her into care but was unable to, both here in [place] and in [place]. They were unable to establish care for her in [place] because her Medicaid was in [place].    Patient speaks Tshiluba or Luba-Kasai, a [place] language, that can be accessed by [employer] (via telephone [phone]) if an appointment is made with them. Patient actually speaks a combination of Swahili and Tshiluba but prefers interpretation in Tshiluba/Luba-Kasai. French interpreter can be used if Tsiluba/Luba-kasai interpreter cannot be found. Does not prefer Swahili.    French interpreter used which was patient's preference. Patient is unaccompanied. Discussed care with Joan Burns after the visit today.    Overall doing well. Current regimen: Biktarvy  (BIC/FTC/TAF)  Misses doses of ARVs never     Med access via Medicaid  CD4 count 102/6% after starting Biktarvy , on Bactrim  SS daily. Repeat CD4 today. Will stop Bactrim  if continues to be virally suppressed.  Discussed ARV adherence and taking ARVs with food    Lab Results   Component Value Date    ACD4 110 (L) 08/04/2023    CD4 5 (L) 08/04/2023    HIVRS Detected (A) 08/04/2023    HIVCP 241 (H) 08/04/2023     CD4, HIV RNA, and safety labs (full return panel) and genotyping (PR, RT, and IN)   Continue current therapy On Biktarvy  and Bactrim  SS, restart Bactrim  as patient is not virally suppressed at this time so will need opportunistic infection prevention.  Discussed importance of ARV adherence  Patient firm on not missing Biktarvy  doses while abroad. Rapid increase in viral load at last visit hard to explain otherwise. Patient had received IV infusions for malaria while in [place], it is possible that whatever was in infusion might have interfered with medication. In this case, there is some concern for resistance developing, will obtain genotype today in case patient continues to be viremic.  Patient does not like blood draws/needles which has led her to canceling her health appointments. I have drawn labs today in hopes that PCP will not require testing at this upcoming visit.  Assessment & Plan  Diabetes mellitus - new  Elevated A1c with recent initiation of diabetes medication. Follow-up pending.  - Ensure adherence to diabetes medication.  - Schedule follow-up for diabetes management with PCP.    HSV-2 infection/Perianal lesions  - resolved  Reports that lesion started by itching, which became a vesicle and then because of itching, the lesions opened. Watery, clear discharge has been draining since then, leasions seems to be getting bigger. Not experiencing pain, but is itchy.   07/11/20 Evaluated by PCP with surface swab revealing mixed gram positive organisms. Started on Bactrim  SS BID.  Dr. JOHNITA Burns in to evaluate patient along with myself. Skin erosions are characteristic of HSV infection that may also  be superinfected with bacteria.  Patient given Valtrex  1000mg  daily for HSV suppression. Refills x 1 year    HIV retinopathy  - chronic   Diagnosed by Ophthalmology, Dr. Bambi  Incidental finding of CWS when patient being evaluated for cataracts.  Daughter reports that patient has not complained of vision changes prior to this diagnosis.  Continue close follow up with Ophthalmology, next appointment due 06/2021 but has not followed up - still needs to schedule    Right breast mass, incidentally found - chronic  Patient had not reported breast mass at previous visits though according to patient and daughter this was a mass present for some years. Patient reports size is stable. No LAD on exam.  Obtain chest x-ray - unremarkable results today  Obtain diagnostic bilateral mammogram with ultrasound if needed. - Appointment made for 02/11/23, soonest available. Joan Burns (daughter) is planning to come to [place] so that she can accompany patient to this visit.  02/11/23 mass imaged. BIRADS-2 repeat in 01/2024, US  shows no malignancy  Was seen at [facility] ED and referred to Breast Clinic    Malaria infection (by patient report) (12/2021)  Infected and treated while in [place].  Reports having malaria treatment x 3 months with oral medication. Followed up with doctor in [place] who said she was cured.  Had joint pain with infection, now resolved.  No symptoms since treatment.  Discussed with Dr. DOROTHA Burns who does not recommend any medications at this time but if she becomes symptomatic - malaise, fatigue, fevers, chills, should order [id number] - Malaria exam, peripheral blood.    +HepC Ab/HCV RNA not detected (03/2020)  Consider baseline ultrasound.   Confirm no treatment for Hep C  01/02/23 Hep C RNA not detected    Uterine leiomyoma - chronic  Had uterine surgery in 2014 per patient report  Seen by GYN 12/27/20 and no further evaluation needed at this time other than routine well woman exam.     Hypothyroidism - chronic, stable  Managed by PCP    Alcohol use disorder - chronic - not addressed today  Drinks 5 beers or wine a day  Since being on antibiotics, patient drinks 1-2 drinks daily.    Right knee pain - chronic, exacerbated - did not address at this visit  Patient has chronic right knee complaints, evaluated by ED for it on 09/25/21 (advised to see PCP about this) and Dr. Keven with [facility] on 10/02/21.   US  PVL ordered for patient and has appointment 10/31/21.  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  Right knee today is slightly swollen and tender somewhat to touch though no signs of infection. Has pain with opposing forces to RLE.  At last visit with Dr. Keven, imaging ordered with possibility for knee injection.  On 11/02/21, seen for knee pain. Given orders for PT and IBU. Suspected autoimmune disorder.   Patient continues to have bilateral knee pain, unable to address with competing priorities.  Needs to schedule PT.    Reported history of Bell's Palsy and Shingles outbreak    Sexual health & secondary prevention  - abstinent  Not in relationship. Last sexual encounter >5 years ago, remains abstinent  She is abstinent  She does not routinely discuss HIV status with partner(s).  Has children and post menopausal.    Lab Results   Component Value Date    RPR Nonreactive 01/02/2023  RPR Nonreactive 07/20/2020     GC/CT NAATs - patient declined screening  RPR - for screening obtained today      Health maintenance  - chronic, acutely exacerbated  PCP: Verneita Sacks, FNP/ Dr. Keven  Elevated A1C and lower H/H, has appointment 04/11/23    Oral health  She does not have a dentist. Last dental exam for some years. 08/06/2023 - referral placed    Eye health  She does  use corrective lenses. Last eye exam followed by Opthalmology. 08/06/2023 - reminded daughter to arrange for follow up due to HIV retinopathy but also with new diabetes diagnosis.      Metabolic conditions  Wt Readings from Last 5 Encounters:   08/04/23 77.7 kg (171 lb 3.2 oz)   04/03/23 76.6 kg (168 lb 12.8 oz)   01/21/23 80.2 kg (176 lb 14.4 oz)   01/02/23 80.1 kg (176 lb 9.6 oz)   12/26/22 78.9 kg (174 lb)     Lab Results   Component Value Date    CREATININE 0.67 08/04/2023    GLUCOSEU Trace (A) 01/02/2023    ALBCRERAT 2.0 01/02/2023    GLU 169 08/04/2023    A1C 8.6 (H) 08/04/2023    ALT 11 08/04/2023    ALT 11 04/03/2023    ALT 11 01/02/2023     # Kidney health - SCr and eGFR   # Bone health - defer mgm't to PCP, on Fosamax   # Diabetes assessment - random glucose today  # NAFLD assessment - monitor over time    Communicable diseases  Lab Results   Component Value Date    QFTTBGOLD Negative 06/29/2020    HEPAIGG Reactive (A) 07/20/2020    HEPBSAB Reactive (A) 07/20/2020    HEPCAB Reactive (A) 04/14/2020    HCVRNA Not Detected 01/02/2023     # TB screening - no longer needed; negative IGRA, low risk  # Hepatitis screening - as noted: See above, Hep A/B IMMUNE  # MMR screening - not assessed    Cancer screening  No results found for: PSASCRN, PSA, PAP, FINALDX  # Cervical - Defer management to GYN  # Breast - repeat mammogram 55m from prior    # Anorectal - will disucss  # Colorectal - Defer to PCP  # Liver - no screening indicated  # Lung - screening not indicated    Cardiovascular disease  Lab Results   Component Value Date    CHOL 173 04/03/2023    HDL 39 (L) 04/03/2023    LDL 109 (H) 04/03/2023    NONHDL 134 (H) 04/03/2023    TRIG 125 04/03/2023     # The 10-year ASCVD risk score (Arnett DK, et al., 2019) is: 30.5%  - is not taking aspirin   - is not taking statin  - BP control good  - never smoker  # AAA screening - no indication for screening    Immunization History   Administered Date(s) Administered    COVID-19 VACCINE,MRNA(MODERNA)(PF) 10/06/2020, 11/03/2020    Influenza Vaccine Quad(IM)6 MO-Adult(PF) 10/30/2021     Immunizations- needs PCV-21, will await next check of CD4 count.  Patient received primary CoVid series and reports getting subsequent CoVid boosters through her work place.- patient currently not working    I personally spent 35 minutes face-to-face and non-face-to-face in the care of this patient, which includes all pre, intra, and post visit time on the date of service.   Time for translation extended out our discussions.  Disposition  Next appointment: 4 weeks       To do @ next RTC  Discuss anal pap.  Alcohol history  Consider abdominal ultrasound  PCV-21      Delvin Gunner, FNP-BC  [facility] Infectious Diseases [facility] at [facility]  [address], [place], [place] [address]    Phone: [phone]   Fax: [phone]             Subjective      Chief Complaint   HIV follow up    HPI  In addition to details in A&P above:  History of Present Illness  08/04/2023  Joan Burns is a 73 year old female with HIV who presents for HIV follow-up due to concerns about increased viral load. She is accompanied by her daughter, Joan Burns.    Her viral load was previously high, decreased with medication adherence, but has recently increased again. She denies stopping her medication and is unsure why the viral load has increased. No symptoms of infectious disease, including fevers, chills, or cough. I am unsure, but she also received some IV treatments for malaria in [place] where she had been visiting which may have interfered with Biktarvy . Also she was overseas for an extended amount of time so there may have been a lapse in medications while abroad but daughter and patient report this was not the case. Daughter believes patient stopped taking ART once she returned from [place].    She is currently taking Biktarvy  for HIV and has previously been prescribed Bactrim . She has some Bactrim  left from a previous prescription and plans to resume taking it. She also takes Valtrex , which she describes as a 'big white pill'.    She was diagnosed with diabetes a few months ago and started on medication. She has not yet returned for a follow-up check-up. Daughter reports that patient admits to not taking Metformin  but daughter will encourage patient to start taking it. Daughter also discovered that patient's other daughter has been buying alcohol and soda for patient which can be contributing to elevated glucose.    She is not currently working and her daughter is assisting with her housing expenses. Joan Burns arrived in town on Friday and plans to stay until August.    Previous HPI:  12/26/22  Was in [place] for 8 months - had supply of medications  Had malaria while she was there, had malaria treatment while she was there. Occurred about 6 months before she returned to the [place] . Came back in May 2024.  Denies any fever, chills, nausea, vomiting, rash, urinary complaints, diarrhea, constipation.  Has put on 14 pounds since last visit.  Pain in chest, difficulty breathing she thinks from gaining weight. Sharp poking pain midsternally, noise with breathing. Happens at rest when sleeping, laying down, wakes her up, lasts about 10-15 minutes, drinks water and then restarts, can't sleep well. Happens every 3rd week. Last happened last Friday. Started 2 months ago.  Bilateral knees and back hurts.  Joan Burns is having a lot of health problems right now. She is trying to see if her sister in [place] can help her mom but for now she is the main contact for care of her mother. Patient agrees.    04/03/23  Received a letter about rent. Not sure what is happening with April rent. Has met with child psychotherapist.  Problems with SNAP benefits - SW in to see patient today  In ED 03/02/23 seen for flu. Sent to breast clinic by [facility].  Has had  insomnia since having fever several weeks ago. Stays awake until 1 or 2 in the morning and has difficulty sleeping at night. Wakes up around 9 or 10 am. Spoke to [place] about this, she reports that another provider recommended using melatonin but it has not been bought by family helping mom at this time. She believes mom is sleeping well but is bored by not working so thinks she does not sleep that long.  Has nausea in the morning since having the fever. No vomiting. Has had some cough. During visit, patient reported that cough was keeping her up at night but patient did not cough during visit and daughter reports that patient is not coughing much and is able to sleep. I canceled tessalon  perles script.    Past Medical History:   Diagnosis Date Dermatitis 04/14/2020    Herpes simplex infection of perianal skin     HIV disease    (CMS-HCC)     Other osteoporosis without current pathological fracture     Other specified hypothyroidism     Pressure injury of buttock, stage 2 (CMS-HCC) 07/11/2020       Social History  Background - From [place]. No longer works. Currently lives in [place], [place].    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 All others negative.      Medications and Allergies  She has a current medication list which includes the following prescription(s): alendronate , biktarvy , accu-chek guide test strips, blood-glucose meter, blood-glucose meter, calcium carbonate-vitamin d3, cetirizine, jardiance , ergocalciferol , accrufer , fluticasone propionate, lagevrio (eua), lancets, levothyroxine , naltrexone , polyvinyl alcohol/povidone/pf, sulfamethoxazole -trimethoprim , valacyclovir , and [DISCONTINUED] metformin .    Allergies: Tetanus-diphtheria toxoids-td      Family History  Her family history is not on file.          Objective:      There were no vitals taken for this visit.  Wt Readings from Last 3 Encounters:   08/04/23 77.7 kg (171 lb 3.2 oz)   04/03/23 76.6 kg (168 lb 12.8 oz)   01/21/23 80.2 kg (176 lb 14.4 oz)       Const looks well and attentive, alert, appropriate   Eyes sclerae anicteric, noninjected OU   ENT no thrush, leukoplakia or oral lesions   Lymph no cervical or supraclavicular LAD           GI Soft, no organomegaly. NTND. NABS.   GU deferred   Rectal deferred   Skin no petechiae, ecchymoses or obvious rashes on clothed exam   MSK normal ROM throughout       Psych Appropriate affect. Eye contact good. Linear thoughts. Fluent speech.     Laboratory Data  Reviewed in Epic today, using Synopsis and Chart Review filters.    Lab Results   Component Value Date    CREATININE 0.67 08/04/2023    QFTTBGOLD Negative 06/29/2020    HEPCAB Reactive (A) 04/14/2020    HCVRNA Not Detected 01/02/2023    CHOL 173 04/03/2023    HDL 39 (L) 04/03/2023    LDL 109 (H) 04/03/2023    NONHDL 134 (H) 04/03/2023  TRIG 125 04/03/2023    A1C 8.6 (H) 08/04/2023

## 2023-12-25 NOTE — Telephone Encounter (Signed)
 [name] daughter called to reschedule appt., but I don't have permission to speak with her and she wouldn't wake up mother to give me permission. I informed her to fill out a consent form when her mother comes in next so that we have permission.

## 2023-12-30 MED ORDER — JARDIANCE 25 MG TABLET
ORAL_TABLET | Freq: Every day | ORAL | 1 refills | 90.00000 days
Start: 2023-12-30 — End: ?

## 2024-01-02 MED ORDER — BLOOD-GLUCOSE METER
0 refills | 0.00000 days
Start: 2024-01-02 — End: ?

## 2024-01-02 MED ORDER — ERGOCALCIFEROL (VITAMIN D2) 1,250 MCG (50,000 UNIT) CAPSULE
ORAL_CAPSULE | ORAL | 3 refills | 91.00000 days
Start: 2024-01-02 — End: ?

## 2024-01-02 MED ORDER — ACCRUFER 30 MG CAPSULE
ORAL_CAPSULE | Freq: Two times a day (BID) | ORAL | 2 refills | 90.00000 days
Start: 2024-01-02 — End: ?

## 2024-01-02 MED ORDER — NALTREXONE 50 MG TABLET
ORAL_TABLET | ORAL | 0 refills | 97.00000 days
Start: 2024-01-02 — End: 2025-01-01

## 2024-01-08 NOTE — Telephone Encounter (Signed)
 Spoke confidentially with patient's son/Burns to schedule appointment for retention. Appointment is set for 01/27/2024.    Barriers to Care:  Are you experiencing any barriers to care like: transportation to medical appointments, stable housing, lack of food, etc.? Yes; patient's Burns schedules transportation for her. Additionally, patient does not like getting her blood drawn. Burns, Joan Burns explained that one time a phlebotomist did this rather than a nurse, and this was a much better experience for Joan Burns because she did not need multiple attempts.  Are you currently taking your medication(s) as prescribed? Yes    Notes: [facility] messaged provider, Joan Burns about the above concerns with the blood draw. Joan Burns also asked if Joan can call her. [facility] included this request in the message as well.    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     Contact Interventions may include:  Placing phone calls to the patient and/or listed contacts.  Sending the patient an Abnormal Lab text message through the [employer] or [facility] cell phone (if a texting agreement is signed).  Sending the patient an Abnormal Lab MyChart/letter.  Contacting the patient's most recent pharmacies as needed for additional information such as contact information or refill history.  Contacting the patient's case manager/caregiver as needed for additional information, if assigned.  Researching other online resources as needed.     If the patient is reached, the [employer] will attempt to:  Update all contact information including primary and secondary phone numbers and emergency contact details and address. Confirm access to MyChart.  Complete the Detectable Viral Load Intervention questionnaire with the patient.  Explore and discuss contributing factors to the last detectable viral load including: Medication access, tolerability or side effects, frequency of missed doses of ART, and other medication adherence concerns.  Explore and discuss any barriers to visit attendance the patient may be experiencing that result in missed appointments, no-shows, cancellations, or lack of a scheduled follow-up appointment. These may include communication barriers (language, phone access, texting, MyChart), transportation, financial concerns, time off work, mental health/substance use, and/or multiple medical appointments.  Make appropriate referrals and link the patient to clinic nurses, social workers, benefits counselors, or other support services in the [facility] ID clinic to address concerns or barriers to care.  Schedule a follow-up appointment at an appropriate interval based on a completed assessment.  Monitor attendance of the patient's scheduled appointment and provide reminder  calls/texts/messages consistent with patient preference, if needed.  Continue follow-up as indicated.     Expected Outcome:  The patient will have a return visit within 6 weeks of identification of detectable viral load lab (>= 51 c/mL) unless stated otherwise by their provider.    If the patient has an urgent medical problem send a chat message to the clinic nursing staff for immediate follow-up.     **If the patient is not able to be located or retained in HIV care, a referral will be placed to the [facility] Counselor for further follow-up.    Duration of Service: 15 minutes    [name]  Linkage and Firefighter Infectious Diseases-[place]

## 2024-01-08 NOTE — Progress Notes (Signed)
 Contacted patient's daughter via mobile and confirmed two identifiers.  Didn't have transportation which is why she missed appointments. Would prefer to be seen by Phlebotomist from now on. Blood draws make her uneasy.  Saw eye doctor, might need cataract surgery soon.  Patient is going back to [place] to get married. Has been together with partner for 20 years. Partner is 20 years younger than patient. Joan Burns is 04/17/24  Patient has follow up appointment with me in 01/2024. Will reassess viral load at that time and may be able to stop antibiotics.

## 2024-02-01 NOTE — Progress Notes (Signed)
 [facility] Delivery [facility] Refill Coordination Note    Specialty Medication(s) to be Shipped:   Infectious Disease: BIKTARVY  50-200-25 mg tablet (bictegrav-emtricit-tenofov ala)    Other medication(s) to be shipped: No additional medications requested for fill at this time [name] daughter would like to check her medications and will call back to add any needed to order.    Specialty Medications not needed at this time: N/A     Joan Burns, DOB: 07-08-1950  Phone: [phone] (home)       All above HIPAA information was verified with patient's family member, daughter Joan Burns.     Was a nurse, learning disability used for this call? No    Completed refill call assessment today to schedule patient's medication shipment from the [facility]  [phone]).  All relevant notes have been reviewed.     Specialty medication(s) and dose(s) confirmed: Regimen is correct and unchanged.   Changes to medications: Joan Burns reports no changes at this time.  Changes to insurance: No  New side effects reported not previously addressed with a pharmacist or physician: None reported  Questions for the pharmacist: No    Confirmed patient received a Conservation Officer, Historic Buildings and a Surveyor, Mining with first shipment. The patient will receive a drug information handout for each medication shipped and additional FDA Medication Guides as required.       DISEASE/MEDICATION-SPECIFIC INFORMATION        N/A    SPECIALTY MEDICATION ADHERENCE     Medication Adherence    Patient reported X missed doses in the last month: 0  Specialty Medication: BIKTARVY  50-200-25 mg tablet (bictegrav-emtricit-tenofov ala)  Patient is on additional specialty medications: No  Informant: child/children              Were doses missed due to medication being on hold? No    BIKTARVY  50-200-25 mg tablet (bictegrav-emtricit-tenofov ala): 7-10 days of medicine on hand       Specialty medication is an injection or given on a cycle: No    REFERRAL TO PHARMACIST     Referral to the pharmacist: Not needed      Joan Burns     Shipping address confirmed in Epic.     Cost and Payment: Patient has a $0 copay, payment information is not required.    Delivery Scheduled: Yes, Expected medication delivery date: 1/15.     Medication will be delivered via UPS to the temporary address in Epic WAM.    Joan Burns [facility]

## 2024-02-03 MED ORDER — BLOOD-GLUCOSE METER
0 refills | 0.00000 days
Start: 2024-02-03 — End: ?

## 2024-02-03 MED ORDER — LANCETS
Freq: Three times a day (TID) | 11 refills | 0.00000 days
Start: 2024-02-03 — End: ?

## 2024-02-03 MED ORDER — ACCU-CHEK GUIDE TEST STRIPS
ORAL_STRIP | Freq: Three times a day (TID) | 11 refills | 34.00000 days
Start: 2024-02-03 — End: ?

## 2024-02-04 MED FILL — BIKTARVY 50 MG-200 MG-25 MG TABLET: ORAL | 90 days supply | Qty: 90 | Fill #2

## 2024-02-16 NOTE — Telephone Encounter (Signed)
 Called with interpreter [phone] once I told patient where I was calling from she disconnected the line.

## 2024-02-18 NOTE — Progress Notes (Signed)
 Removed flag.    Madinah Cathcart-Rowe  Benefits Counselor  Time of Intervention: 5 mins
